# Patient Record
Sex: Male | Born: 1955 | Race: White | Hispanic: No | Marital: Single | Smoking: Never smoker
Health system: Southern US, Community
[De-identification: ages and names within clinical notes are randomized; demographics above are authoritative.]

## PROBLEM LIST (undated history)

## (undated) DIAGNOSIS — M1990 Unspecified osteoarthritis, unspecified site: Secondary | ICD-10-CM

## (undated) DIAGNOSIS — R7303 Prediabetes: Secondary | ICD-10-CM

## (undated) HISTORY — PX: HERNIA REPAIR: SHX51

## (undated) HISTORY — DX: Unspecified osteoarthritis, unspecified site: M19.90

---

## 2013-02-04 ENCOUNTER — Encounter (INDEPENDENT_AMBULATORY_CARE_PROVIDER_SITE_OTHER): Payer: Self-pay

## 2013-02-04 ENCOUNTER — Ambulatory Visit (INDEPENDENT_AMBULATORY_CARE_PROVIDER_SITE_OTHER): Payer: BC Managed Care – PPO | Admitting: General Surgery

## 2013-02-04 ENCOUNTER — Encounter (INDEPENDENT_AMBULATORY_CARE_PROVIDER_SITE_OTHER): Payer: Self-pay | Admitting: General Surgery

## 2013-02-04 VITALS — BP 158/82 | HR 84 | Temp 98.9°F | Resp 14 | Ht 63.0 in | Wt 211.4 lb

## 2013-02-04 DIAGNOSIS — K4090 Unilateral inguinal hernia, without obstruction or gangrene, not specified as recurrent: Secondary | ICD-10-CM | POA: Insufficient documentation

## 2013-02-04 NOTE — H&P (Signed)
Blake Adams is an 57 y.o. male.   Chief Complaint: Bilateral inguinal hernia HPI: Has had these hernias for years.  Recently more symptomatic, but not incarcerated.  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  Past Medical History  Diagnosis Date  . Arthritis     History reviewed. No pertinent past surgical history.  Family History  Problem Relation Age of Onset  . Cancer Maternal Aunt     breast  . Cancer Paternal Uncle     colon  . Cancer Maternal Aunt     OVARIAN   Social History:  reports that he has never smoked. He has never used smokeless tobacco. He reports that he does not drink alcohol or use illicit drugs.  Allergies: Not on File   (Not in a hospital admission)  No results found for this or any previous visit (from the past 48 hour(s)). No results found.  Review of Systems  Constitutional: Negative.   HENT: Negative.   Eyes: Negative.   Respiratory: Negative.   Cardiovascular: Negative.   Gastrointestinal: Negative.   Genitourinary: Negative.   Musculoskeletal: Negative.   Skin: Negative.   Neurological: Negative.   Endo/Heme/Allergies: Negative.   Psychiatric/Behavioral: Negative.     Blood pressure 158/82, pulse 84, temperature 98.9 F (37.2 C), temperature source Temporal, resp. rate 14, height 5\' 3"  (1.6 m), weight 211 lb 6.4 oz (95.89 kg). Physical Exam  Constitutional: He appears well-developed and well-nourished.  Moderately overweight   HENT:  Head: Normocephalic and atraumatic.  Eyes: Conjunctivae and EOM are normal. Pupils are equal, round, and reactive to light.  Neck: Normal range of motion. Neck supple.  Cardiovascular: Normal rate, regular rhythm and normal heart sounds.   Respiratory: Effort normal and breath sounds normal.  GI: Soft. Bowel sounds are normal. There is tenderness. A hernia is present.  Hernia confirmed positive in the right inguinal area (largest on the right.) and confirmed positive in the left inguinal area.  Genitourinary: Penis normal.  Musculoskeletal: Normal range of motion.       Right wrist: He exhibits tenderness (in splint from a recent fall).  Neurological: He is alert.  Skin: Skin is warm.  Psychiatric: He has a normal mood and affect. His behavior is normal. Judgment and thought content normal.     Assessment/Plan Bilateral reducible inguinal hernias.  Because of the patient's size and the size of his hernias I do not think he is the best candidate for laparoscopic repair. We plan on bilateral opening of the hernia repairs with mesh. Risk and benefits have been explained to the patient and he wishes to proceed. There is a moderate chance of postoperative urinary retention. This has been explained to the patient. He will get preoperative antibiotics.  Cherylynn Ridges 02/04/2013, 11:00 AM

## 2013-02-04 NOTE — Progress Notes (Signed)
This patient's office visit was documented as a preoperative history and physical. 

## 2013-02-19 ENCOUNTER — Encounter (HOSPITAL_BASED_OUTPATIENT_CLINIC_OR_DEPARTMENT_OTHER): Payer: Self-pay | Admitting: *Deleted

## 2013-02-21 ENCOUNTER — Encounter (HOSPITAL_BASED_OUTPATIENT_CLINIC_OR_DEPARTMENT_OTHER)
Admission: RE | Admit: 2013-02-21 | Discharge: 2013-02-21 | Disposition: A | Discharge status: Home / Self Care | Payer: Federal, State, Local not specified - PPO | Source: Ambulatory Visit | Attending: General Surgery | Admitting: General Surgery

## 2013-02-21 LAB — CBC WITH DIFFERENTIAL/PLATELET
Basophils Relative: 1 % (ref 0–1)
Eosinophils Absolute: 0.3 10*3/uL (ref 0.0–0.7)
Hemoglobin: 14.8 g/dL (ref 13.0–17.0)
Lymphocytes Relative: 16 % (ref 12–46)
Lymphs Abs: 1.7 10*3/uL (ref 0.7–4.0)
MCH: 29.7 pg (ref 26.0–34.0)
MCHC: 33.5 g/dL (ref 30.0–36.0)
Monocytes Relative: 10 % (ref 3–12)
Neutro Abs: 7.4 10*3/uL (ref 1.7–7.7)
Neutrophils Relative %: 70 % (ref 43–77)
Platelets: 317 10*3/uL (ref 150–400)
RBC: 4.99 MIL/uL (ref 4.22–5.81)
WBC: 10.5 10*3/uL (ref 4.0–10.5)

## 2013-02-21 LAB — BASIC METABOLIC PANEL
Calcium: 10.3 mg/dL (ref 8.4–10.5)
Chloride: 100 mEq/L (ref 96–112)
GFR calc Af Amer: >90 mL/min (ref >90)
GFR calc non Af Amer: >90 mL/min (ref >90)
Potassium: 4.7 mEq/L (ref 3.5–5.1)
Sodium: 139 mEq/L (ref 135–145)

## 2013-02-24 ENCOUNTER — Ambulatory Visit (HOSPITAL_BASED_OUTPATIENT_CLINIC_OR_DEPARTMENT_OTHER): Payer: Federal, State, Local not specified - PPO | Admitting: Certified Registered"

## 2013-02-24 ENCOUNTER — Encounter (HOSPITAL_BASED_OUTPATIENT_CLINIC_OR_DEPARTMENT_OTHER)
Admission: RE | Disposition: A | Discharge status: Home / Self Care | Payer: Self-pay | Source: Ambulatory Visit | Attending: General Surgery

## 2013-02-24 ENCOUNTER — Encounter (HOSPITAL_BASED_OUTPATIENT_CLINIC_OR_DEPARTMENT_OTHER): Payer: Self-pay

## 2013-02-24 ENCOUNTER — Encounter (HOSPITAL_BASED_OUTPATIENT_CLINIC_OR_DEPARTMENT_OTHER): Payer: Federal, State, Local not specified - PPO | Admitting: Certified Registered"

## 2013-02-24 ENCOUNTER — Ambulatory Visit (HOSPITAL_BASED_OUTPATIENT_CLINIC_OR_DEPARTMENT_OTHER)
Admission: RE | Admit: 2013-02-24 | Discharge: 2013-02-24 | Disposition: A | Discharge status: Home / Self Care | Payer: Federal, State, Local not specified - PPO | Source: Ambulatory Visit | Attending: General Surgery | Admitting: General Surgery

## 2013-02-24 DIAGNOSIS — Z01810 Encounter for preprocedural cardiovascular examination: Secondary | ICD-10-CM | POA: Insufficient documentation

## 2013-02-24 DIAGNOSIS — Z01812 Encounter for preprocedural laboratory examination: Secondary | ICD-10-CM | POA: Insufficient documentation

## 2013-02-24 DIAGNOSIS — M129 Arthropathy, unspecified: Secondary | ICD-10-CM | POA: Insufficient documentation

## 2013-02-24 DIAGNOSIS — K4090 Unilateral inguinal hernia, without obstruction or gangrene, not specified as recurrent: Secondary | ICD-10-CM

## 2013-02-24 HISTORY — PX: INSERTION OF MESH: SHX5868

## 2013-02-24 HISTORY — PX: INGUINAL HERNIA REPAIR: SHX194

## 2013-02-24 LAB — POCT HEMOGLOBIN-HEMACUE: Hemoglobin: 13.3 g/dL (ref 13.0–17.0)

## 2013-02-24 SURGERY — REPAIR, HERNIA, INGUINAL, ADULT
Anesthesia: General | Site: Groin | Laterality: Bilateral

## 2013-02-24 SURGERY — REPAIR, HERNIA, INGUINAL, BILATERAL, ADULT
Anesthesia: General | Laterality: Bilateral

## 2013-02-24 MED ORDER — MIDAZOLAM HCL 2 MG/2ML IJ SOLN: 1.0000 mg | INTRAMUSCULAR | Status: DC | PRN

## 2013-02-24 MED ORDER — OXYCODONE-ACETAMINOPHEN 5-325 MG PO TABS: 1.0000 | ORAL_TABLET | ORAL | Status: DC | PRN

## 2013-02-24 MED ORDER — MIDAZOLAM HCL 2 MG/2ML IJ SOLN
INTRAMUSCULAR | Status: AC
  Filled 2013-02-24: qty 2

## 2013-02-24 MED ORDER — CEFAZOLIN SODIUM-DEXTROSE 2-3 GM-% IV SOLR
2.0000 g | INTRAVENOUS | Status: AC
  Administered 2013-02-24: 2 g via INTRAVENOUS

## 2013-02-24 MED ORDER — OXYCODONE HCL 5 MG PO TABS
ORAL_TABLET | ORAL | Status: AC
  Filled 2013-02-24: qty 1

## 2013-02-24 MED ORDER — FENTANYL CITRATE 0.05 MG/ML IJ SOLN: 50.0000 ug | INTRAMUSCULAR | Status: DC | PRN

## 2013-02-24 MED ORDER — GLYCOPYRROLATE 0.2 MG/ML IJ SOLN
INTRAMUSCULAR | Status: DC | PRN
  Administered 2013-02-24: .5 mg via INTRAVENOUS

## 2013-02-24 MED ORDER — PROPOFOL 10 MG/ML IV BOLUS
INTRAVENOUS | Status: DC | PRN
  Administered 2013-02-24: 200 mg via INTRAVENOUS

## 2013-02-24 MED ORDER — BUPIVACAINE HCL (PF) 0.5 % IJ SOLN
INTRAMUSCULAR | Status: DC | PRN
  Administered 2013-02-24 (×2): 10 mL

## 2013-02-24 MED ORDER — HYDROMORPHONE HCL PF 1 MG/ML IJ SOLN
0.2500 mg | INTRAMUSCULAR | Status: DC | PRN
  Administered 2013-02-24: 0.5 mg via INTRAVENOUS

## 2013-02-24 MED ORDER — BUPIVACAINE HCL (PF) 0.5 % IJ SOLN
INTRAMUSCULAR | Status: AC
  Filled 2013-02-24: qty 30

## 2013-02-24 MED ORDER — NEOSTIGMINE METHYLSULFATE 1 MG/ML IJ SOLN
INTRAMUSCULAR | Status: DC | PRN
  Administered 2013-02-24: 3.5 mg via INTRAVENOUS

## 2013-02-24 MED ORDER — ONDANSETRON HCL 4 MG/2ML IJ SOLN
INTRAMUSCULAR | Status: DC | PRN
  Administered 2013-02-24: 4 mg via INTRAVENOUS

## 2013-02-24 MED ORDER — ROCURONIUM BROMIDE 100 MG/10ML IV SOLN
INTRAVENOUS | Status: DC | PRN
  Administered 2013-02-24: 10 mg via INTRAVENOUS
  Administered 2013-02-24: 50 mg via INTRAVENOUS

## 2013-02-24 MED ORDER — DEXAMETHASONE SODIUM PHOSPHATE 4 MG/ML IJ SOLN
INTRAMUSCULAR | Status: DC | PRN
  Administered 2013-02-24: 10 mg via INTRAVENOUS

## 2013-02-24 MED ORDER — MIDAZOLAM HCL 5 MG/5ML IJ SOLN
INTRAMUSCULAR | Status: DC | PRN
  Administered 2013-02-24: 2 mg via INTRAVENOUS

## 2013-02-24 MED ORDER — LIDOCAINE HCL (CARDIAC) 20 MG/ML IV SOLN
INTRAVENOUS | Status: DC | PRN
  Administered 2013-02-24: 60 mg via INTRAVENOUS

## 2013-02-24 MED ORDER — FENTANYL CITRATE 0.05 MG/ML IJ SOLN
INTRAMUSCULAR | Status: AC
  Filled 2013-02-24: qty 6

## 2013-02-24 MED ORDER — METOCLOPRAMIDE HCL 5 MG/ML IJ SOLN: 10.0000 mg | Freq: Once | INTRAMUSCULAR | Status: DC | PRN

## 2013-02-24 MED ORDER — SODIUM CHLORIDE 0.9 % IR SOLN
Status: DC | PRN
  Administered 2013-02-24: 14:00:00

## 2013-02-24 MED ORDER — OXYCODONE HCL 5 MG/5ML PO SOLN: 5.0000 mg | Freq: Once | ORAL | Status: AC | PRN

## 2013-02-24 MED ORDER — OXYCODONE HCL 5 MG PO TABS
5.0000 mg | ORAL_TABLET | Freq: Once | ORAL | Status: AC | PRN
  Administered 2013-02-24: 5 mg via ORAL

## 2013-02-24 MED ORDER — LACTATED RINGERS IV SOLN
INTRAVENOUS | Status: DC
  Administered 2013-02-24 (×2): via INTRAVENOUS

## 2013-02-24 MED ORDER — HYDROMORPHONE HCL PF 1 MG/ML IJ SOLN
INTRAMUSCULAR | Status: AC
  Filled 2013-02-24: qty 1

## 2013-02-24 MED ORDER — FENTANYL CITRATE 0.05 MG/ML IJ SOLN
INTRAMUSCULAR | Status: DC | PRN
  Administered 2013-02-24 (×2): 50 ug via INTRAVENOUS
  Administered 2013-02-24: 100 ug via INTRAVENOUS

## 2013-02-24 MED ORDER — CEFAZOLIN SODIUM-DEXTROSE 2-3 GM-% IV SOLR
INTRAVENOUS | Status: AC
  Filled 2013-02-24: qty 50

## 2013-02-24 SURGICAL SUPPLY — 51 items
BAG DECANTER FOR FLEXI CONT (MISCELLANEOUS) ×2 IMPLANT
BLADE SURG 10 STRL SS (BLADE) ×2 IMPLANT
BLADE SURG 15 STRL LF DISP TIS (BLADE) ×2 IMPLANT
BLADE SURG 15 STRL SS (BLADE) ×2
BLADE SURG ROTATE 9660 (MISCELLANEOUS) ×2 IMPLANT
CANISTER SUCT 1200ML W/VALVE (MISCELLANEOUS) ×2 IMPLANT
CHLORAPREP W/TINT 26ML (MISCELLANEOUS) ×2 IMPLANT
CLEANER CAUTERY TIP 5X5 PAD (MISCELLANEOUS) ×1 IMPLANT
COVER MAYO STAND STRL (DRAPES) ×2 IMPLANT
COVER TABLE BACK 60X90 (DRAPES) ×2 IMPLANT
DECANTER SPIKE VIAL GLASS SM (MISCELLANEOUS) ×2 IMPLANT
DERMABOND ADVANCED (GAUZE/BANDAGES/DRESSINGS) ×1
DERMABOND ADVANCED .7 DNX12 (GAUZE/BANDAGES/DRESSINGS) ×1 IMPLANT
DRAIN PENROSE 1/2X12 LTX STRL (WOUND CARE) ×2 IMPLANT
DRAPE LAPAROTOMY TRNSV 102X78 (DRAPE) ×2 IMPLANT
DRAPE UTILITY XL STRL (DRAPES) ×2 IMPLANT
DRSG TEGADERM 2-3/8X2-3/4 SM (GAUZE/BANDAGES/DRESSINGS) IMPLANT
DRSG TEGADERM 4X4.75 (GAUZE/BANDAGES/DRESSINGS) ×4 IMPLANT
ELECT REM PT RETURN 9FT ADLT (ELECTROSURGICAL) ×2
ELECTRODE REM PT RTRN 9FT ADLT (ELECTROSURGICAL) ×1 IMPLANT
GLOVE BIO SURGEON STRL SZ7 (GLOVE) ×2 IMPLANT
GLOVE BIOGEL PI IND STRL 7.0 (GLOVE) ×1 IMPLANT
GLOVE BIOGEL PI IND STRL 8 (GLOVE) ×1 IMPLANT
GLOVE BIOGEL PI INDICATOR 7.0 (GLOVE) ×1
GLOVE BIOGEL PI INDICATOR 8 (GLOVE) ×1
GLOVE ECLIPSE 6.5 STRL STRAW (GLOVE) ×4 IMPLANT
GLOVE ECLIPSE 7.5 STRL STRAW (GLOVE) ×2 IMPLANT
GOWN PREVENTION PLUS XLARGE (GOWN DISPOSABLE) ×6 IMPLANT
MESH HERNIA 3X6 (Mesh General) ×2 IMPLANT
NEEDLE HYPO 25X1 1.5 SAFETY (NEEDLE) ×2 IMPLANT
NS IRRIG 1000ML POUR BTL (IV SOLUTION) ×2 IMPLANT
PACK BASIN DAY SURGERY FS (CUSTOM PROCEDURE TRAY) ×2 IMPLANT
PAD CLEANER CAUTERY TIP 5X5 (MISCELLANEOUS) ×1
PENCIL BUTTON HOLSTER BLD 10FT (ELECTRODE) ×2 IMPLANT
SLEEVE SCD COMPRESS KNEE MED (MISCELLANEOUS) ×2 IMPLANT
SPONGE INTESTINAL PEANUT (DISPOSABLE) ×2 IMPLANT
SPONGE LAP 4X18 X RAY DECT (DISPOSABLE) ×6 IMPLANT
STRIP CLOSURE SKIN 1/2X4 (GAUZE/BANDAGES/DRESSINGS) ×4 IMPLANT
SUT ETHIBOND 0 MO6 C/R (SUTURE) ×4 IMPLANT
SUT MON AB 4-0 PC3 18 (SUTURE) ×4 IMPLANT
SUT PROLENE 0 CT 2 (SUTURE) ×8 IMPLANT
SUT VIC AB 3-0 SH 27 (SUTURE) ×4
SUT VIC AB 3-0 SH 27X BRD (SUTURE) ×4 IMPLANT
SUT VICRYL 4-0 PS2 18IN ABS (SUTURE) IMPLANT
SUT VICRYL AB 3 0 TIES (SUTURE) ×2 IMPLANT
SYR BULB 3OZ (MISCELLANEOUS) ×2 IMPLANT
SYR CONTROL 10ML LL (SYRINGE) ×2 IMPLANT
TOWEL OR 17X24 6PK STRL BLUE (TOWEL DISPOSABLE) ×4 IMPLANT
TOWEL OR NON WOVEN STRL DISP B (DISPOSABLE) IMPLANT
TUBE CONNECTING 20X1/4 (TUBING) ×2 IMPLANT
YANKAUER SUCT BULB TIP NO VENT (SUCTIONS) ×2 IMPLANT

## 2013-02-24 NOTE — Op Note (Signed)
OPERATIVE REPORT  DATE OF OPERATION: 02/24/2013  PATIENT:  Blake Adams  57 y.o. male  PRE-OPERATIVE DIAGNOSIS:  Bilateral inguinal hernias  POST-OPERATIVE DIAGNOSIS:  Bilateral direct inguinal hernias  PROCEDURE:  Procedure(s): HERNIA REPAIR INGUINAL ADULT BILATERAL WITH MESH INSERTION OF MESH  SURGEON:  Surgeon(s): Cherylynn Ridges, MD  ASSISTANT: Blue, PA-S  ANESTHESIA:   general  EBL: <50 ml  BLOOD ADMINISTERED: none  DRAINS: none   SPECIMEN:  No Specimen  COUNTS CORRECT:  YES  PROCEDURE DETAILS: The patient was taken to the operating room and placed on the table in the supine position. After an adequate general endotracheal anesthetic was administered he was prepped and draped in usual sterile manner exposing both groins.  After proper time out was performed identifying the patient and the procedure to be performed, we started off on the right side repairing the right inguinal hernia. The left side was repaired almost identically but the differences to be dictated later.  A transverse curvilinear incision approximately 8 cm long was made using a #15 blade and taken down to subcutaneous tissue. We used electrocautery to obtain adequate hemostasis and also to dissect down to the external oblique fascia. We opened the fascia along its fibers down through the superficial ring. We then mobilized the spermatic cord at the pubic tubercle using a Penrose drain. It was noted immediately that the patient had a large direct in the hernia. There was no indirect component on the right side or the left side.  A large direct hernia was dissected free of the spermatic cord. We then excised the major portion of the sac then reapproximated the edges of the sac with each other using 0 Ethibond suture. We then imbricated the suture line using 0 Ethibond suture. We then inserted an oval piece of polypropylene mesh measuring approximately 5 x 3 cm in size to the pubic tubercle attached it to the  conjoin tendon anterior medially and reflected portion of the inguinal ligament inferolaterally.  Once this was done we irrigated with antibiotic solution. We allowed the spermatic large fall back into the inguinal canal. We reapproximated the actual date fascia on top the cord. We then reapproximated the Scarpa's fascia using interrupted 0 Vicryl sutures. We injected 0.5% Marcaine into the subcutaneous tissue. We then reapproximated the skin using running subcuticular stitch of 3-0 Monocryl.  The left side was done almost identically with the exception that the excess hernia sac was not excised. We just inverted the hernia sac into the defect and imbricated the transversalis fascia on top of using interrupted 0 Ethibond suture. We then repaired the floor with mesh as we had done on the right side and the closure was the same all needle counts, sponge counts, and instrument counts were correct. 0.5% Marcaine was injected into the subcutaneous tissue on the left side also and the skin was closed using Dermabond 4-0 Monocryl and Steri-Strips and a Tegaderm.  PATIENT DISPOSITION:  PACU - hemodynamically stable.   Kenzli Barritt O 12/8/20142:52 PM

## 2013-02-24 NOTE — Interval H&P Note (Signed)
History and Physical Interval Note:  02/24/2013 11:24 AM  Blake Adams  has presented today for surgery, with the diagnosis of Bilateral inguinal hernias  The various methods of treatment have been discussed with the patient and family. After consideration of risks, benefits and other options for treatment, the patient has consented to  Procedure(s): HERNIA REPAIR INGUINAL ADULT BILATERAL WITH MESH (Bilateral) INSERTION OF MESH (Bilateral) as a surgical intervention .  The patient's history has been reviewed, patient examined, no change in status, stable for surgery.  I have reviewed the patient's chart and labs.  Questions were answered to the patient's satisfaction.     Marriana Hibberd, Marta Lamas

## 2013-02-24 NOTE — H&P (View-Only) (Signed)
Blake Adams is an 57 y.o. male.   Chief Complaint: Bilateral inguinal hernia HPI: Has had these hernias for years.  Recently more symptomatic, but not incarcerated.  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  Past Medical History  Diagnosis Date  . Arthritis     History reviewed. No pertinent past surgical history.  Family History  Problem Relation Age of Onset  . Cancer Maternal Aunt     breast  . Cancer Paternal Uncle     colon  . Cancer Maternal Aunt     OVARIAN   Social History:  reports that he has never smoked. He has never used smokeless tobacco. He reports that he does not drink alcohol or use illicit drugs.  Allergies: Not on File   (Not in a hospital admission)  No results found for this or any previous visit (from the past 48 hour(s)). No results found.  Review of Systems  Constitutional: Negative.   HENT: Negative.   Eyes: Negative.   Respiratory: Negative.   Cardiovascular: Negative.   Gastrointestinal: Negative.   Genitourinary: Negative.   Musculoskeletal: Negative.   Skin: Negative.   Neurological: Negative.   Endo/Heme/Allergies: Negative.   Psychiatric/Behavioral: Negative.     Blood pressure 158/82, pulse 84, temperature 98.9 F (37.2 C), temperature source Temporal, resp. rate 14, height 5' 3" (1.6 m), weight 211 lb 6.4 oz (95.89 kg). Physical Exam  Constitutional: He appears well-developed and well-nourished.  Moderately overweight   HENT:  Head: Normocephalic and atraumatic.  Eyes: Conjunctivae and EOM are normal. Pupils are equal, round, and reactive to light.  Neck: Normal range of motion. Neck supple.  Cardiovascular: Normal rate, regular rhythm and normal heart sounds.   Respiratory: Effort normal and breath sounds normal.  GI: Soft. Bowel sounds are normal. There is tenderness. A hernia is present.  Hernia confirmed positive in the right inguinal area (largest on the right.) and confirmed positive in the left inguinal area.  Genitourinary: Penis normal.  Musculoskeletal: Normal range of motion.       Right wrist: He exhibits tenderness (in splint from a recent fall).  Neurological: He is alert.  Skin: Skin is warm.  Psychiatric: He has a normal mood and affect. His behavior is normal. Judgment and thought content normal.     Assessment/Plan Bilateral reducible inguinal hernias.  Because of the patient's size and the size of his hernias I do not think he is the best candidate for laparoscopic repair. We plan on bilateral opening of the hernia repairs with mesh. Risk and benefits have been explained to the patient and he wishes to proceed. There is a moderate chance of postoperative urinary retention. This has been explained to the patient. He will get preoperative antibiotics.  Rey Dansby O 02/04/2013, 11:00 AM    

## 2013-02-24 NOTE — Anesthesia Preprocedure Evaluation (Signed)
Anesthesia Evaluation  Patient identified by MRN, date of birth, ID band Patient awake    Reviewed: Allergy & Precautions, H&P , NPO status , Patient's Chart, lab work & pertinent test results, reviewed documented beta blocker date and time   Airway Mallampati: II TM Distance: >3 FB Neck ROM: full    Dental   Pulmonary neg pulmonary ROS,  breath sounds clear to auscultation        Cardiovascular negative cardio ROS  Rhythm:regular     Neuro/Psych negative neurological ROS  negative psych ROS   GI/Hepatic negative GI ROS, Neg liver ROS,   Endo/Other  negative endocrine ROS  Renal/GU negative Renal ROS  negative genitourinary   Musculoskeletal   Abdominal   Peds  Hematology negative hematology ROS (+)   Anesthesia Other Findings See surgeon's H&P   Reproductive/Obstetrics negative OB ROS                           Anesthesia Physical Anesthesia Plan  ASA: II  Anesthesia Plan: General   Post-op Pain Management:    Induction: Intravenous  Airway Management Planned: LMA and Oral ETT  Additional Equipment:   Intra-op Plan:   Post-operative Plan: Extubation in OR  Informed Consent: I have reviewed the patients History and Physical, chart, labs and discussed the procedure including the risks, benefits and alternatives for the proposed anesthesia with the patient or authorized representative who has indicated his/her understanding and acceptance.   Dental Advisory Given  Plan Discussed with: CRNA and Surgeon  Anesthesia Plan Comments:         Anesthesia Quick Evaluation

## 2013-02-24 NOTE — Transfer of Care (Signed)
Immediate Anesthesia Transfer of Care Note  Patient: Blake Adams  Procedure(s) Performed: Procedure(s): HERNIA REPAIR INGUINAL ADULT BILATERAL WITH MESH (Bilateral) INSERTION OF MESH (Bilateral)  Patient Location: PACU  Anesthesia Type:General  Level of Consciousness: awake, alert , oriented and patient cooperative  Airway & Oxygen Therapy: Patient Spontanous Breathing and Patient connected to face mask oxygen  Post-op Assessment: Report given to PACU RN and Post -op Vital signs reviewed and stable  Post vital signs: Reviewed and stable  Complications: No apparent anesthesia complications

## 2013-02-24 NOTE — Anesthesia Postprocedure Evaluation (Signed)
  Anesthesia Post-op Note  Patient: Blake Adams  Procedure(s) Performed: Procedure(s): HERNIA REPAIR INGUINAL ADULT BILATERAL WITH MESH (Bilateral) INSERTION OF MESH (Bilateral)  Patient Location: PACU  Anesthesia Type:General  Level of Consciousness: awake and alert   Airway and Oxygen Therapy: Patient Spontanous Breathing  Post-op Pain: moderate  Post-op Assessment: Post-op Vital signs reviewed, Patient's Cardiovascular Status Stable and Respiratory Function Stable  Post-op Vital Signs: Reviewed  Filed Vitals:   02/24/13 1530  BP: 128/81  Pulse: 75  Temp:   Resp: 13    Complications: No apparent anesthesia complications

## 2013-02-25 NOTE — Anesthesia Postprocedure Evaluation (Signed)
  Anesthesia Post-op Note  Patient: Blake Adams  Procedure(s) Performed: Procedure(s): HERNIA REPAIR INGUINAL ADULT BILATERAL WITH MESH (Bilateral) INSERTION OF MESH (Bilateral)  Patient Location: PACU  Anesthesia Type:General  Level of Consciousness: awake, alert  and oriented  Airway and Oxygen Therapy: Patient Spontanous Breathing  Post-op Pain: mild  Post-op Assessment: Post-op Vital signs reviewed, Patient's Cardiovascular Status Stable, Respiratory Function Stable, Patent Airway and Pain level controlled  Post-op Vital Signs: stable  Complications: No apparent anesthesia complications

## 2013-02-26 ENCOUNTER — Encounter (HOSPITAL_BASED_OUTPATIENT_CLINIC_OR_DEPARTMENT_OTHER): Payer: Self-pay | Admitting: General Surgery

## 2013-02-26 NOTE — Addendum Note (Signed)
Addendum created 02/26/13 1210 by Hart Robinsons, MD   Modules edited: Anesthesia Attestations

## 2013-03-05 ENCOUNTER — Other Ambulatory Visit (INDEPENDENT_AMBULATORY_CARE_PROVIDER_SITE_OTHER): Payer: Self-pay

## 2013-03-05 ENCOUNTER — Telehealth (INDEPENDENT_AMBULATORY_CARE_PROVIDER_SITE_OTHER): Payer: Self-pay

## 2013-03-05 DIAGNOSIS — G8918 Other acute postprocedural pain: Secondary | ICD-10-CM

## 2013-03-05 MED ORDER — OXYCODONE-ACETAMINOPHEN 5-325 MG PO TABS: 1.0000 | ORAL_TABLET | ORAL | Status: DC | PRN

## 2013-03-05 NOTE — Telephone Encounter (Signed)
Dr Lindie Spruce said this is not a burn but probable hematoma. Advised per Dr Lindie Spruce to apply warm compresses to area. If no better in next 24-48 hours she will call our office back. Dr Stormy Fabian refill of percocet. Will have urge doctor write for refill and they will pick up prescription this afternoon.

## 2013-03-05 NOTE — Telephone Encounter (Signed)
The patient's sister called to report that he has a blister on his penis filled with fluid after his hernia surgery.  She states he was covered with hot blankets before taken into the OR and she thinks it may be a burn.  She is requesting advice.

## 2013-03-18 ENCOUNTER — Encounter (INDEPENDENT_AMBULATORY_CARE_PROVIDER_SITE_OTHER): Payer: Self-pay | Admitting: General Surgery

## 2013-03-18 ENCOUNTER — Ambulatory Visit (INDEPENDENT_AMBULATORY_CARE_PROVIDER_SITE_OTHER): Payer: BC Managed Care – PPO | Admitting: General Surgery

## 2013-03-18 VITALS — BP 132/96 | HR 72 | Temp 98.7°F | Resp 14 | Ht 63.5 in | Wt 211.4 lb

## 2013-03-18 DIAGNOSIS — Z09 Encounter for follow-up examination after completed treatment for conditions other than malignant neoplasm: Secondary | ICD-10-CM | POA: Insufficient documentation

## 2013-03-18 NOTE — Progress Notes (Signed)
The patient is status post bilateral inguinal hernia repairs. About a week ago he had some significant blistering and swelling of his distal penis. This has completely resolved without rupturing.  New dressings were removed and incisions are intact with no evidence of dehiscence or infection. He is to return to see me on when necessary basis. He has no evidence of recurrent hernia.

## 2015-03-03 ENCOUNTER — Ambulatory Visit (HOSPITAL_COMMUNITY)
Admission: RE | Admit: 2015-03-03 | Discharge: 2015-03-03 | Disposition: A | Discharge status: Home / Self Care | Payer: Federal, State, Local not specified - PPO | Source: Ambulatory Visit | Attending: Family Medicine | Admitting: Family Medicine

## 2015-03-03 ENCOUNTER — Other Ambulatory Visit (HOSPITAL_COMMUNITY): Payer: Self-pay | Admitting: Family Medicine

## 2015-03-03 DIAGNOSIS — M25561 Pain in right knee: Secondary | ICD-10-CM

## 2015-03-03 DIAGNOSIS — M1711 Unilateral primary osteoarthritis, right knee: Secondary | ICD-10-CM | POA: Diagnosis not present

## 2015-04-16 ENCOUNTER — Ambulatory Visit (INDEPENDENT_AMBULATORY_CARE_PROVIDER_SITE_OTHER): Payer: Federal, State, Local not specified - PPO | Admitting: Urology

## 2015-04-16 ENCOUNTER — Other Ambulatory Visit: Payer: Self-pay | Admitting: Urology

## 2015-04-16 DIAGNOSIS — R972 Elevated prostate specific antigen [PSA]: Secondary | ICD-10-CM

## 2015-04-16 DIAGNOSIS — N400 Enlarged prostate without lower urinary tract symptoms: Secondary | ICD-10-CM

## 2015-04-23 ENCOUNTER — Ambulatory Visit (HOSPITAL_COMMUNITY)
Admission: RE | Admit: 2015-04-23 | Discharge: 2015-04-23 | Disposition: A | Discharge status: Home / Self Care | Payer: Federal, State, Local not specified - PPO | Source: Ambulatory Visit | Attending: Urology | Admitting: Urology

## 2015-04-23 ENCOUNTER — Encounter (HOSPITAL_COMMUNITY): Payer: Self-pay

## 2015-04-23 DIAGNOSIS — R972 Elevated prostate specific antigen [PSA]: Secondary | ICD-10-CM | POA: Insufficient documentation

## 2015-04-23 MED ORDER — CEFTRIAXONE SODIUM 1 G IJ SOLR
INTRAMUSCULAR | Status: AC
  Filled 2015-04-23: qty 10

## 2015-04-23 MED ORDER — LIDOCAINE HCL (PF) 1 % IJ SOLN
INTRAMUSCULAR | Status: AC
Start: 2015-04-23 — End: 2015-04-23
  Administered 2015-04-23: 2 mL
  Filled 2015-04-23: qty 5

## 2015-04-23 MED ORDER — CEFTRIAXONE SODIUM 1 G IJ SOLR
1.0000 g | Freq: Once | INTRAMUSCULAR | Status: AC
Start: 2015-04-23 — End: 2015-04-23
  Administered 2015-04-23: 1 g via INTRAMUSCULAR

## 2015-04-23 MED ORDER — LIDOCAINE HCL (PF) 2 % IJ SOLN
INTRAMUSCULAR | Status: AC
  Filled 2015-04-23: qty 10

## 2015-04-23 MED ORDER — LIDOCAINE HCL (PF) 2 % IJ SOLN
10.0000 mL | Freq: Once | INTRAMUSCULAR | Status: AC
  Administered 2015-04-23: 10 mL

## 2015-04-23 MED ORDER — LIDOCAINE HCL (PF) 2 % IJ SOLN
INTRAMUSCULAR | Status: AC
  Administered 2015-04-23: 12:00:00 10 mL
  Filled 2015-04-23: qty 10

## 2015-04-23 NOTE — Discharge Instructions (Signed)
Transrectal Ultrasound-Guided Biopsy °A transrectal ultrasound-guided biopsy is a procedure to take samples of tissue from your prostate. Ultrasound images are used to guide the procedure. It is usually done to check the prostate gland for cancer. °BEFORE THE PROCEDURE °· Do not eat or drink after midnight on the night before your procedure. °· Take medicines as your doctor tells you. °· Your doctor may have you stop taking some medicines 5-7 days before the procedure. °· You will be given an enema before your procedure. During an enema, a liquid is put into your butt (rectum) to clear out waste. °· You may have lab tests the day of your procedure. °· Make plans to have someone drive you home. °PROCEDURE °· You will be given medicine to help you relax before the procedure. An IV tube will be put into one of your veins. It will be used to give fluids and medicine. °· You will be given medicine to reduce the risk of infection (antibiotic). °· You will be placed on your side. °· A probe with gel will be put in your butt. This is used to take pictures of your prostate and the area around it. °· A medicine to numb the area is put into your prostate. °· A biopsy needle is then inserted and guided to your prostate. °· Samples of prostate tissue are taken. The needle is removed. °· The samples are sent to a lab to be checked. Results are usually back in 2-3 days. °AFTER THE PROCEDURE °· You will be taken to a room where you will be watched until you are doing okay. °· You may have some pain in the area around your butt. You will be given medicines for this. °· You may be able to go home the same day. Sometimes, an overnight stay in the hospital is needed. °  °This information is not intended to replace advice given to you by your health care provider. Make sure you discuss any questions you have with your health care provider. °  °Document Released: 02/22/2009 Document Revised: 03/11/2013 Document Reviewed:  10/23/2012 °Elsevier Interactive Patient Education ©2016 [employer] ° °

## 2015-11-05 ENCOUNTER — Ambulatory Visit (INDEPENDENT_AMBULATORY_CARE_PROVIDER_SITE_OTHER): Payer: Federal, State, Local not specified - PPO | Admitting: Urology

## 2015-11-05 DIAGNOSIS — R3121 Asymptomatic microscopic hematuria: Secondary | ICD-10-CM | POA: Diagnosis not present

## 2015-11-05 DIAGNOSIS — R972 Elevated prostate specific antigen [PSA]: Secondary | ICD-10-CM | POA: Diagnosis not present

## 2016-08-04 ENCOUNTER — Other Ambulatory Visit (HOSPITAL_COMMUNITY)
Admission: RE | Admit: 2016-08-04 | Discharge: 2016-08-04 | Disposition: A | Discharge status: Home / Self Care | Payer: Federal, State, Local not specified - PPO | Source: Other Acute Inpatient Hospital | Attending: Urology | Admitting: Urology

## 2016-08-04 ENCOUNTER — Ambulatory Visit (INDEPENDENT_AMBULATORY_CARE_PROVIDER_SITE_OTHER): Payer: Federal, State, Local not specified - PPO | Admitting: Urology

## 2016-08-04 DIAGNOSIS — N401 Enlarged prostate with lower urinary tract symptoms: Secondary | ICD-10-CM

## 2016-08-04 DIAGNOSIS — R3121 Asymptomatic microscopic hematuria: Secondary | ICD-10-CM | POA: Insufficient documentation

## 2016-08-04 DIAGNOSIS — R972 Elevated prostate specific antigen [PSA]: Secondary | ICD-10-CM

## 2016-08-04 DIAGNOSIS — R3912 Poor urinary stream: Secondary | ICD-10-CM

## 2016-08-04 LAB — URINALYSIS, COMPLETE (UACMP) WITH MICROSCOPIC
Bacteria, UA: NONE SEEN
Bilirubin Urine: NEGATIVE
Glucose, UA: NEGATIVE mg/dL
KETONES UR: NEGATIVE mg/dL
Leukocytes, UA: NEGATIVE
Nitrite: NEGATIVE
PH: 5 (ref 5.0–8.0)
Protein, ur: NEGATIVE mg/dL
Specific Gravity, Urine: 1.019 (ref 1.005–1.030)

## 2017-02-02 ENCOUNTER — Ambulatory Visit: Payer: Federal, State, Local not specified - PPO | Admitting: Urology

## 2017-02-02 DIAGNOSIS — R351 Nocturia: Secondary | ICD-10-CM

## 2017-02-02 DIAGNOSIS — N401 Enlarged prostate with lower urinary tract symptoms: Secondary | ICD-10-CM

## 2017-02-02 DIAGNOSIS — R972 Elevated prostate specific antigen [PSA]: Secondary | ICD-10-CM

## 2017-06-26 IMAGING — US US BIOPSY PROSTATE MULTIPLE
1 series · 14 of 22 positions shown · non-contrast
Comparison: none

[Series 1: us biopsy prostate multiple · 0.11mm/px · 14 of 22 slices shown]
[im 1/22]
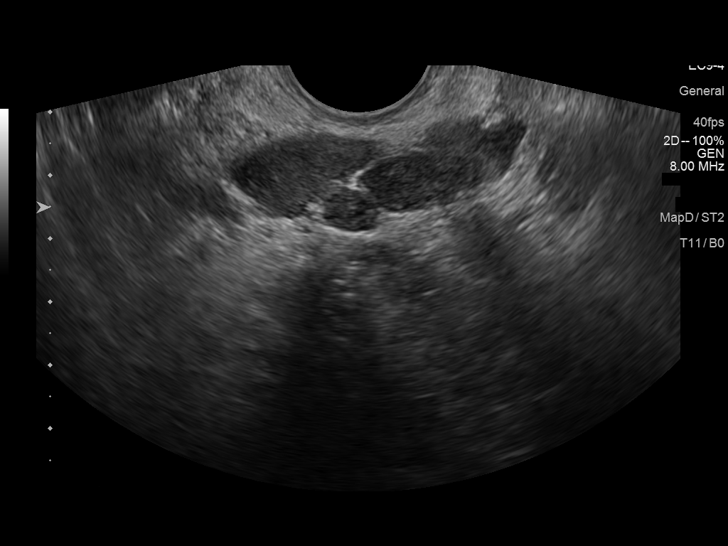
[im 3/22]
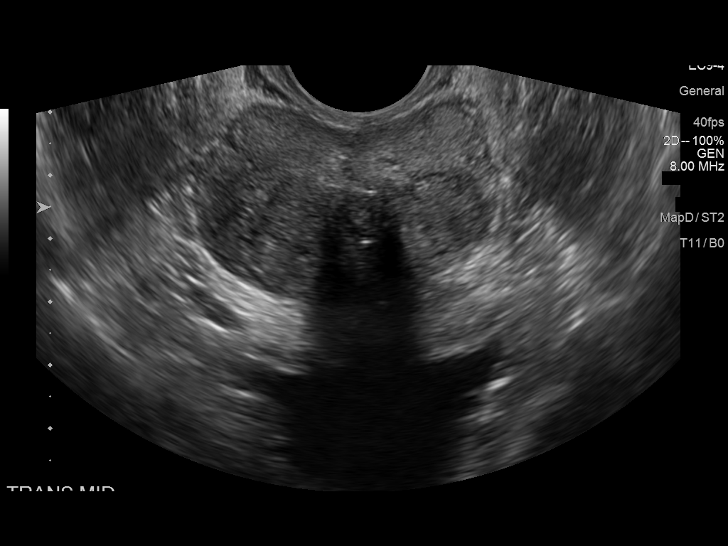
[im 4/22]
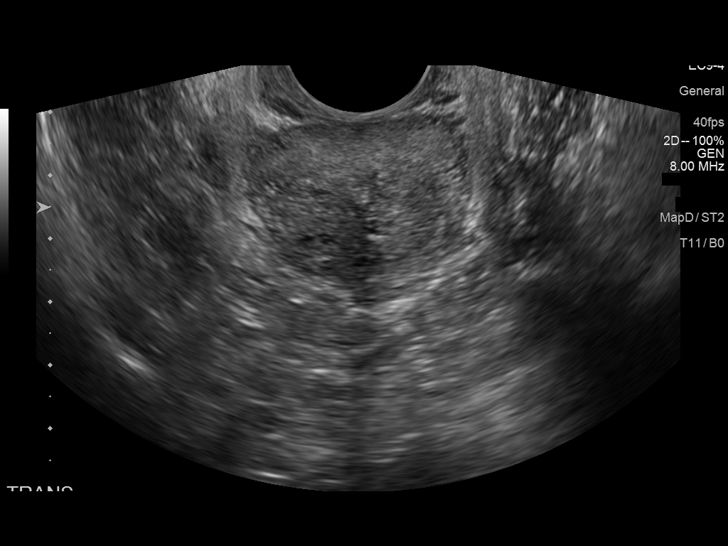
[im 6/22]
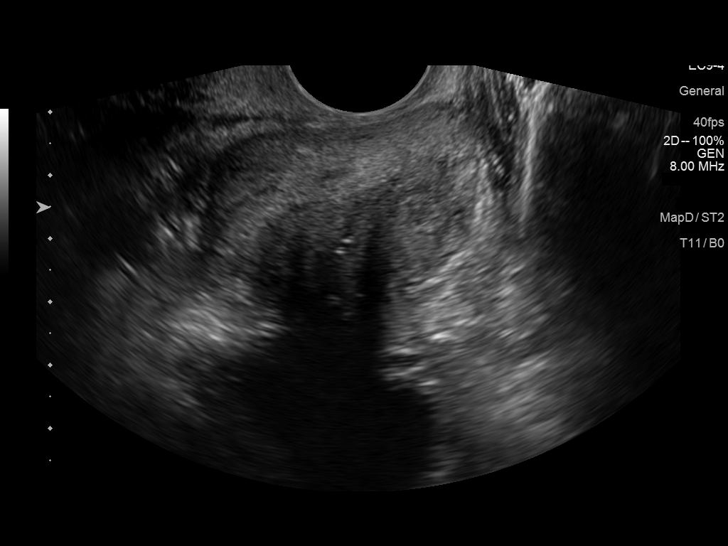
[im 8/22]
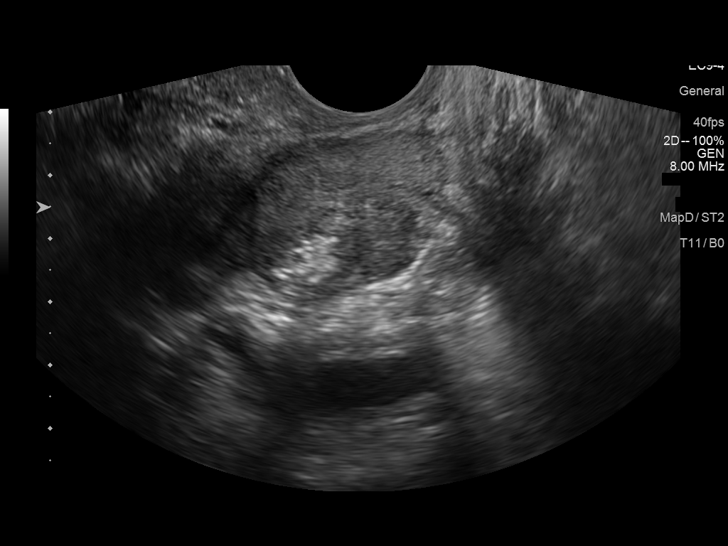
[im 9/22]
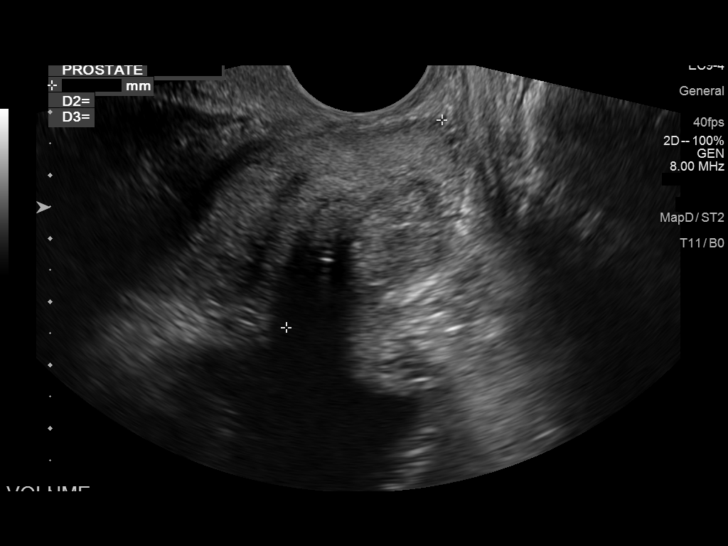
[im 11/22]
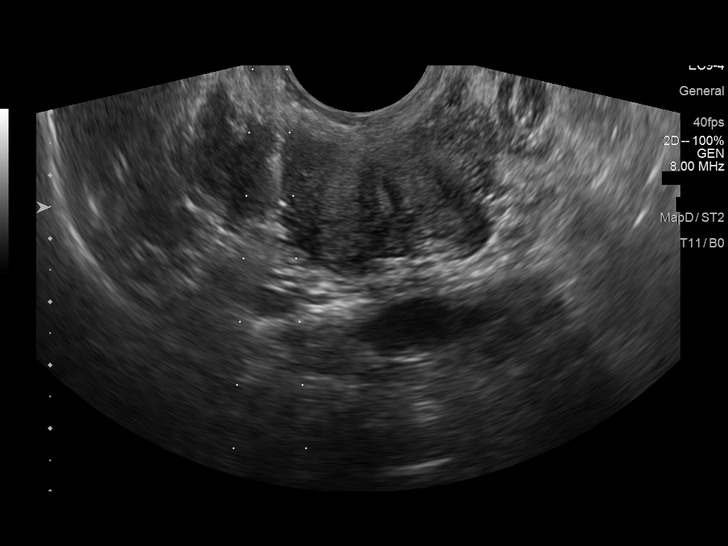
[im 12/22]
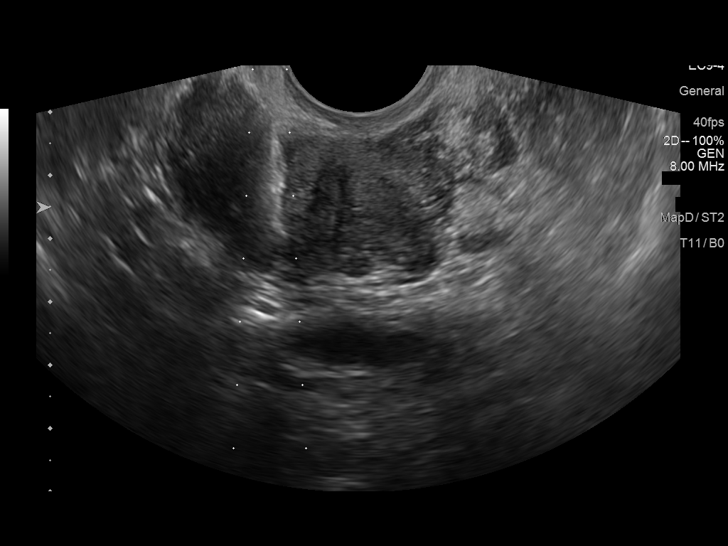
[im 14/22]
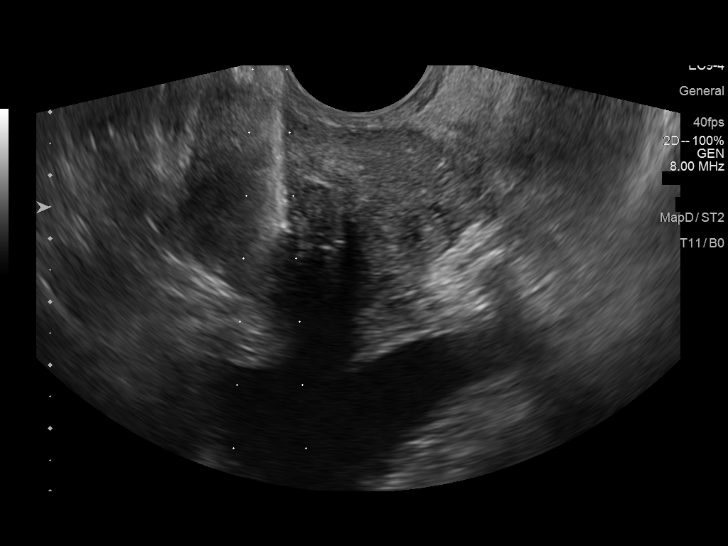
[im 15/22]
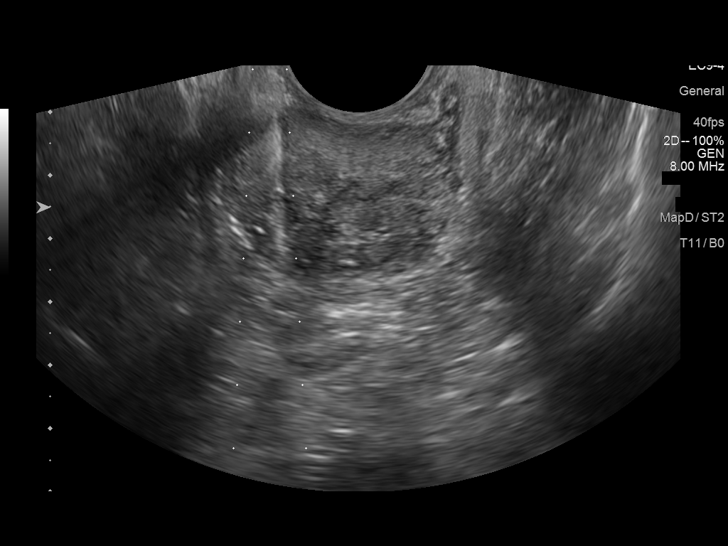
[im 17/22]
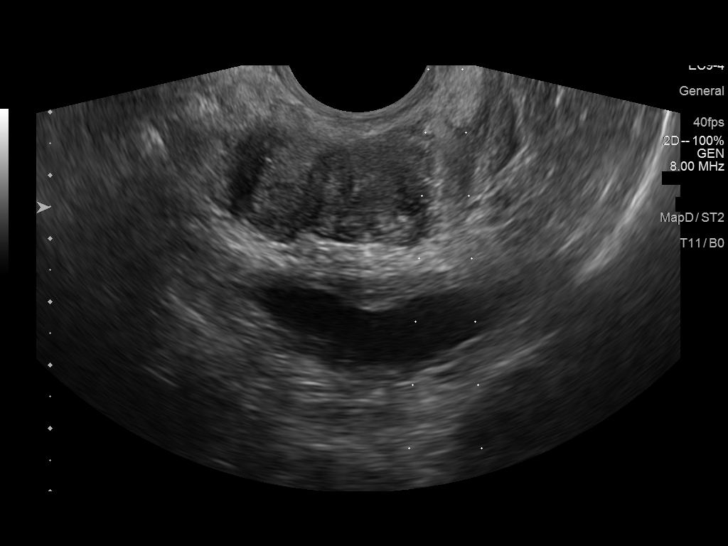
[im 19/22]
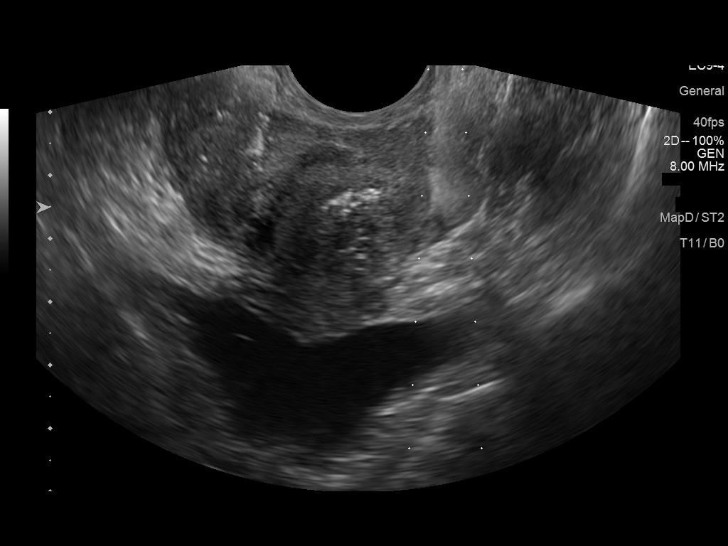
[im 20/22]
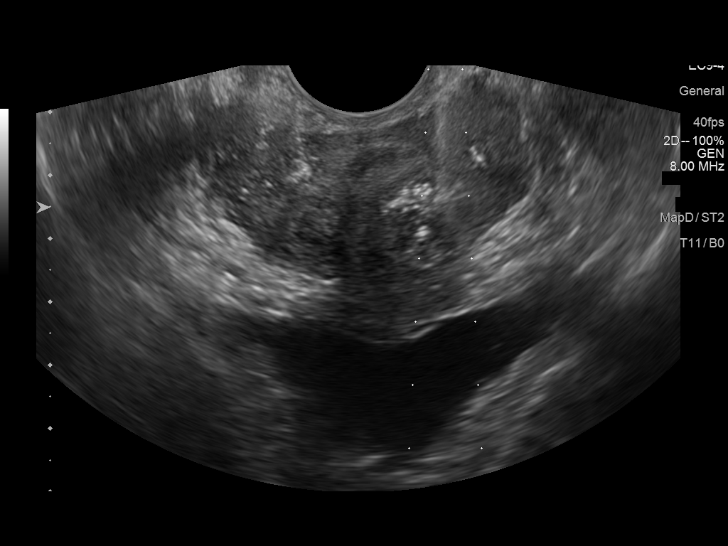
[im 22/22]
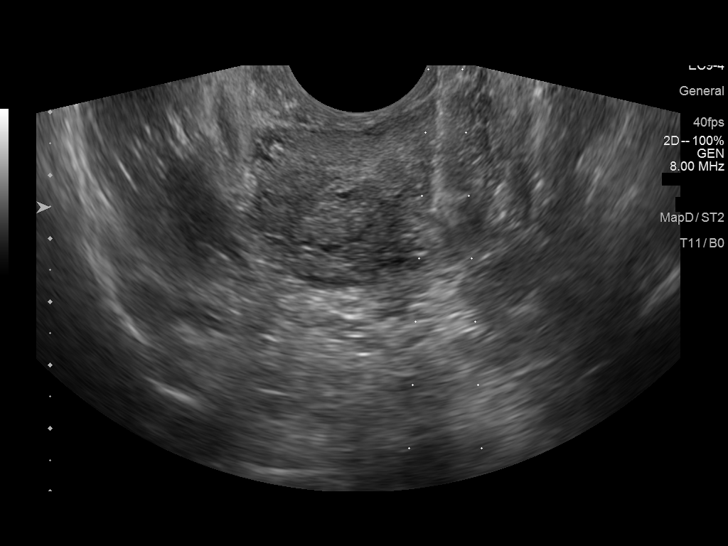

[14 of 22 positions shown; findings below may reference images not displayed]

Canned report from images found in remote index.

Refer to host system for actual result text.

## 2018-02-22 ENCOUNTER — Ambulatory Visit: Payer: Federal, State, Local not specified - PPO | Admitting: Urology

## 2018-02-22 DIAGNOSIS — R3912 Poor urinary stream: Secondary | ICD-10-CM

## 2018-02-22 DIAGNOSIS — N401 Enlarged prostate with lower urinary tract symptoms: Secondary | ICD-10-CM | POA: Diagnosis not present

## 2018-02-22 DIAGNOSIS — R972 Elevated prostate specific antigen [PSA]: Secondary | ICD-10-CM

## 2021-04-13 ENCOUNTER — Encounter: Payer: Self-pay | Admitting: Internal Medicine

## 2021-06-01 ENCOUNTER — Ambulatory Visit (INDEPENDENT_AMBULATORY_CARE_PROVIDER_SITE_OTHER): Payer: Self-pay | Admitting: *Deleted

## 2021-06-01 ENCOUNTER — Other Ambulatory Visit: Payer: Self-pay

## 2021-06-01 VITALS — Ht 64.0 in | Wt 245.0 lb

## 2021-06-01 DIAGNOSIS — Z1211 Encounter for screening for malignant neoplasm of colon: Secondary | ICD-10-CM

## 2021-06-01 NOTE — Progress Notes (Addendum)
Gastroenterology Pre-Procedure Review ? ?Request Date: 06/01/2021 ?Requesting Physician: Dr. Karie Kirks, no previous TCS ? ?PATIENT REVIEW QUESTIONS: The patient responded to the following health history questions as indicated:   ? ?1. Diabetes Melitis: yes, type II, takes medicine in evenings ?2. Joint replacements in the past 12 months: no ?3. Major health problems in the past 3 months: no ?4. Has an artificial valve or MVP: no ?5. Has a defibrillator: no ?6. Has been advised in past to take antibiotics in advance of a procedure like teeth cleaning: no ?7. Family history of colon cancer: yes, uncle: age 63's ?8. Alcohol Use: no ?9. Illicit drug Use: no ?10. History of sleep apnea: no ?11. History of coronary artery or other vascular stents placed within the last 12 months: no ?12. History of any prior anesthesia complications: no ?13. Body mass index is 42.05 kg/m?. ?   ?MEDICATIONS & ALLERGIES:    ?Patient reports the following regarding taking any blood thinners:   ?Plavix? no ?Aspirin? no ?Coumadin? no ?Brilinta? no ?Xarelto? no ?Eliquis? no ?Pradaxa? no ?Savaysa? no ?Effient? no ? ?Patient confirms/reports the following medications:  ?Current Outpatient Medications  ?Medication Sig Dispense Refill  ? acetaminophen (TYLENOL) 325 MG tablet Take 650 mg by mouth as needed.    ? Cholecalciferol (VITAMIN D3) 125 MCG (5000 UT) CAPS Take by mouth daily.    ? cyanocobalamin 2000 MCG tablet Take 2,000 mcg by mouth daily.    ? folic acid (FOLVITE) 1 MG tablet Take 1 mg by mouth daily.    ? losartan (COZAAR) 100 MG tablet Take 100 mg by mouth daily.    ? metFORMIN (GLUCOPHAGE-XR) 500 MG 24 hr tablet Take 500 mg by mouth every evening.    ? methotrexate 2.5 MG tablet Take 2.5 mg by mouth once a week. Takes 8 tablets weekly.    ? naproxen sodium (ALEVE) 220 MG tablet Take 220 mg by mouth as needed.    ? zinc gluconate 50 MG tablet Take 50 mg by mouth daily.    ? ?No current facility-administered medications for this visit.   ? ? ?Patient confirms/reports the following allergies:  ?No Known Allergies ? ?No orders of the defined types were placed in this encounter. ? ? ?AUTHORIZATION INFORMATION ?Primary Insurance: Medicare,  ID #: [id number] ?Pre-Cert / Josem Kaufmann required: No, not required ? ?Secondary Insurance: [place],  [place] #: [id number],  Group #: [id number] ?Pre-Cert / Josem Kaufmann required: No, not required ? ?Third Insurance: [employer], ID#: [id number] ? ?SCHEDULE INFORMATION: ?Procedure has been scheduled as follows:  ?Date: 06/20/2021 , Time: 12:00 ?Location: [facility] with Dr. Abbey Chatters ? ?This Gastroenterology Pre-Precedure Review Form is being routed to the following provider(s): Aliene Altes, PA-C ?  ?

## 2021-06-02 NOTE — Progress Notes (Signed)
Okay to schedule.  ASA 3 due to BMI. ? ?1 day prior to procedure: Okay to take metformin as prescribed. ?Day of procedure: No diabetes medications the morning prior to his procedure.  Would be okay to take his metformin that evening after his colonoscopy. ?

## 2021-06-06 ENCOUNTER — Encounter: Payer: Self-pay | Admitting: *Deleted

## 2021-06-06 MED ORDER — PEG 3350-KCL-NA BICARB-NACL 420 G PO SOLR: 4000.0000 mL | Freq: Once | ORAL | 0 refills | Status: AC

## 2021-06-06 NOTE — Addendum Note (Signed)
Addended by: Metro Kung on: 06/06/2021 03:33 PM ? ? Modules accepted: Orders ? ?

## 2021-06-07 ENCOUNTER — Encounter: Payer: Self-pay | Admitting: *Deleted

## 2021-06-07 NOTE — Progress Notes (Signed)
Spoke to pt.  Scheduled procedure for 06/20/2021.  Informed pt Pre-op scheduled for 06/16/2021 at 2:15.  Pt aware that he will be given procedure time at his Pre-op appointment.  Reviewed prep instructions with pt by phone.  Informed pt of medication adjustments.  Pt aware that I am mailing out instructions.  Confirmed mailing address. ?

## 2021-06-15 NOTE — Patient Instructions (Signed)
? ? ? ? ? ? Blake Adams ? 06/15/2021  ?  ? '@PREFPERIOPPHARMACY'$ @ ? ? Your procedure is scheduled on  06/20/2021. ? ? Report to [facility] at  1000 A.M. ? ? Call this number if you have problems the morning of surgery: ? [phone] ? ? Remember: ? Follow the diet and prep instructions given to you by the office. ? ?DO NOT take any medication for diabetes the morning of your procedure. ?  ? Take these medicines the morning of surgery with A SIP OF WATER  ? ?None. ?  ? Do not wear jewelry, make-up or nail polish. ? Do not wear lotions, powders, or perfumes, or deodorant. ? Do not shave 48 hours prior to surgery.  Men may shave face and neck. ? Do not bring valuables to the hospital. ? [facility] is not responsible for any belongings or valuables. ? ?Contacts, dentures or bridgework may not be worn into surgery.  Leave your suitcase in the car.  After surgery it may be brought to your room. ? ?For patients admitted to the hospital, discharge time will be determined by your treatment team. ? ?Patients discharged the day of surgery will not be allowed to drive home and must have someone with them for 24 hours.  ? ? ?Special instructions:   DO NOT smoke tobacco or vape for 24 hours before your procedure. ? ?Please read over the following fact sheets that you were given. ?Anesthesia Post-op Instructions and Care and Recovery After Surgery ?  ? ? ? Colonoscopy, Adult, Care After ?This sheet gives you information about how to care for yourself after your procedure. Your health care provider may also give you more specific instructions. If you have problems or questions, contact your health care provider. ?What can I expect after the procedure? ?After the procedure, it is common to have: ?A small amount of blood in your stool for 24 hours after the procedure. ?Some gas. ?Mild cramping or bloating of your abdomen. ?Follow these instructions at home: ?Eating and drinking ? ?Drink enough fluid to keep your urine pale  yellow. ?Follow instructions from your health care provider about eating or drinking restrictions. ?Resume your normal diet as instructed by your health care provider. Avoid heavy or fried foods that are hard to digest. ?Activity ?Rest as told by your health care provider. ?Avoid sitting for a long time without moving. Get up to take short walks every 1-2 hours. This is important to improve blood flow and breathing. Ask for help if you feel weak or unsteady. ?Return to your normal activities as told by your health care provider. Ask your health care provider what activities are safe for you. ?Managing cramping and bloating ? ?Try walking around when you have cramps or feel bloated. ?Apply heat to your abdomen as told by your health care provider. Use the heat source that your health care provider recommends, such as a moist heat pack or a heating pad. ?Place a towel between your skin and the heat source. ?Leave the heat on for 20-30 minutes. ?Remove the heat if your skin turns bright red. This is especially important if you are unable to feel pain, heat, or cold. You may have a greater risk of getting burned. ?General instructions ?If you were given a sedative during the procedure, it can affect you for several hours. Do not drive or operate machinery until your health care provider says that it is safe. ?For the first 24 hours after the procedure: ?  Do not sign important documents. ?Do not drink alcohol. ?Do your regular daily activities at a slower pace than normal. ?Eat soft foods that are easy to digest. ?Take over-the-counter and prescription medicines only as told by your health care provider. ?Keep all follow-up visits as told by your health care provider. This is important. ?Contact a health care provider if: ?You have blood in your stool 2-3 days after the procedure. ?Get help right away if you have: ?More than a small spotting of blood in your stool. ?Large blood clots in your stool. ?Swelling of your  abdomen. ?Nausea or vomiting. ?A fever. ?Increasing pain in your abdomen that is not relieved with medicine. ?Summary ?After the procedure, it is common to have a small amount of blood in your stool. You may also have mild cramping and bloating of your abdomen. ?If you were given a sedative during the procedure, it can affect you for several hours. Do not drive or operate machinery until your health care provider says that it is safe. ?Get help right away if you have a lot of blood in your stool, nausea or vomiting, a fever, or increased pain in your abdomen. ?This information is not intended to replace advice given to you by your health care provider. Make sure you discuss any questions you have with your health care provider. ?Document Revised: 01/10/2019 Document Reviewed: 09/30/2018 ?Elsevier Patient Education ? [place]. ?Monitored Anesthesia Care, Care After ?This sheet gives you information about how to care for yourself after your procedure. Your health care provider may also give you more specific instructions. If you have problems or questions, contact your health care provider. ?What can I expect after the procedure? ?After the procedure, it is common to have: ?Tiredness. ?Forgetfulness about what happened after the procedure. ?Impaired judgment for important decisions. ?Nausea or vomiting. ?Some difficulty with balance. ?Follow these instructions at home: ?For the time period you were told by your health care provider: ?  ?Rest as needed. ?Do not participate in activities where you could fall or become injured. ?Do not drive or use machinery. ?Do not drink alcohol. ?Do not take sleeping pills or medicines that cause drowsiness. ?Do not make important decisions or sign legal documents. ?Do not take care of children on your own. ?Eating and drinking ?Follow the diet that is recommended by your health care provider. ?Drink enough fluid to keep your urine pale yellow. ?If you vomit: ?Drink water,  juice, or soup when you can drink without vomiting. ?Make sure you have little or no nausea before eating solid foods. ?General instructions ?Have a responsible adult stay with you for the time you are told. It is important to have someone help care for you until you are awake and alert. ?Take over-the-counter and prescription medicines only as told by your health care provider. ?If you have sleep apnea, surgery and certain medicines can increase your risk for breathing problems. Follow instructions from your health care provider about wearing your sleep device: ?Anytime you are sleeping, including during daytime naps. ?While taking prescription pain medicines, sleeping medicines, or medicines that make you drowsy. ?Avoid smoking. ?Keep all follow-up visits as told by your health care provider. This is important. ?Contact a health care provider if: ?You keep feeling nauseous or you keep vomiting. ?You feel light-headed. ?You are still sleepy or having trouble with balance after 24 hours. ?You develop a rash. ?You have a fever. ?You have redness or swelling around the IV site. ?Get help right away  if: ?You have trouble breathing. ?You have new-onset confusion at home. ?Summary ?For several hours after your procedure, you may feel tired. You may also be forgetful and have poor judgment. ?Have a responsible adult stay with you for the time you are told. It is important to have someone help care for you until you are awake and alert. ?Rest as told. Do not drive or operate machinery. Do not drink alcohol or take sleeping pills. ?Get help right away if you have trouble breathing, or if you suddenly become confused. ?This information is not intended to replace advice given to you by your health care provider. Make sure you discuss any questions you have with your health care provider. ?Document Revised: 11/20/2019 Document Reviewed: 02/06/2019 ?Elsevier Patient Education ? [place]. ? ?

## 2021-06-16 ENCOUNTER — Encounter (HOSPITAL_COMMUNITY): Payer: Self-pay

## 2021-06-16 ENCOUNTER — Other Ambulatory Visit: Payer: Self-pay

## 2021-06-16 ENCOUNTER — Encounter (HOSPITAL_COMMUNITY)
Admission: RE | Admit: 2021-06-16 | Discharge: 2021-06-16 | Disposition: A | Discharge status: Home / Self Care | Payer: Medicare Other | Source: Ambulatory Visit | Attending: Internal Medicine | Admitting: Internal Medicine

## 2021-06-16 VITALS — BP 148/87 | HR 66 | Temp 97.5°F | Resp 18 | Ht 64.0 in | Wt 245.0 lb

## 2021-06-16 DIAGNOSIS — Z01818 Encounter for other preprocedural examination: Secondary | ICD-10-CM | POA: Diagnosis not present

## 2021-06-16 DIAGNOSIS — E119 Type 2 diabetes mellitus without complications: Secondary | ICD-10-CM | POA: Diagnosis not present

## 2021-06-16 HISTORY — DX: Prediabetes: R73.03

## 2021-06-16 LAB — BASIC METABOLIC PANEL
Anion gap: 12 (ref 5–15)
BUN: 16 mg/dL (ref 8–23)
CO2: 26 mmol/L (ref 22–32)
Calcium: 9.2 mg/dL (ref 8.9–10.3)
Chloride: 98 mmol/L (ref 98–111)
Creatinine, Ser: 1.07 mg/dL (ref 0.61–1.24)
GFR, Estimated: >60 mL/min (ref >60)
Glucose, Bld: 102 mg/dL — ABNORMAL HIGH (ref 70–99)
Potassium: 3.8 mmol/L (ref 3.5–5.1)
Sodium: 136 mmol/L (ref 135–145)

## 2021-06-20 ENCOUNTER — Ambulatory Visit (HOSPITAL_COMMUNITY): Payer: Medicare Other | Admitting: Anesthesiology

## 2021-06-20 ENCOUNTER — Ambulatory Visit (HOSPITAL_COMMUNITY)
Admission: RE | Admit: 2021-06-20 | Discharge: 2021-06-20 | Disposition: A | Discharge status: Home / Self Care | Payer: Medicare Other | Attending: Internal Medicine | Admitting: Internal Medicine

## 2021-06-20 ENCOUNTER — Encounter (HOSPITAL_COMMUNITY): Payer: Self-pay

## 2021-06-20 ENCOUNTER — Encounter (HOSPITAL_COMMUNITY)
Admission: RE | Disposition: A | Discharge status: Home / Self Care | Payer: Self-pay | Source: Home / Self Care | Attending: Internal Medicine

## 2021-06-20 ENCOUNTER — Ambulatory Visit (HOSPITAL_BASED_OUTPATIENT_CLINIC_OR_DEPARTMENT_OTHER): Payer: Medicare Other | Admitting: Anesthesiology

## 2021-06-20 DIAGNOSIS — K5730 Diverticulosis of large intestine without perforation or abscess without bleeding: Secondary | ICD-10-CM

## 2021-06-20 DIAGNOSIS — K648 Other hemorrhoids: Secondary | ICD-10-CM | POA: Insufficient documentation

## 2021-06-20 DIAGNOSIS — Z6841 Body Mass Index (BMI) 40.0 and over, adult: Secondary | ICD-10-CM | POA: Insufficient documentation

## 2021-06-20 DIAGNOSIS — R7303 Prediabetes: Secondary | ICD-10-CM | POA: Diagnosis not present

## 2021-06-20 DIAGNOSIS — I10 Essential (primary) hypertension: Secondary | ICD-10-CM | POA: Insufficient documentation

## 2021-06-20 DIAGNOSIS — Z1211 Encounter for screening for malignant neoplasm of colon: Secondary | ICD-10-CM

## 2021-06-20 DIAGNOSIS — K635 Polyp of colon: Secondary | ICD-10-CM | POA: Diagnosis not present

## 2021-06-20 DIAGNOSIS — D124 Benign neoplasm of descending colon: Secondary | ICD-10-CM | POA: Insufficient documentation

## 2021-06-20 HISTORY — PX: COLONOSCOPY WITH PROPOFOL: SHX5780

## 2021-06-20 HISTORY — PX: POLYPECTOMY: SHX5525

## 2021-06-20 LAB — GLUCOSE, CAPILLARY: Glucose-Capillary: 126 mg/dL — ABNORMAL HIGH (ref 70–99)

## 2021-06-20 SURGERY — COLONOSCOPY WITH PROPOFOL
Anesthesia: General

## 2021-06-20 MED ORDER — PROPOFOL 10 MG/ML IV BOLUS
INTRAVENOUS | Status: DC | PRN
  Administered 2021-06-20: 300 mg via INTRAVENOUS
  Administered 2021-06-20: 100 mg via INTRAVENOUS

## 2021-06-20 MED ORDER — LACTATED RINGERS IV SOLN: INTRAVENOUS | Status: DC

## 2021-06-20 MED ORDER — PROPOFOL 500 MG/50ML IV EMUL
INTRAVENOUS | Status: DC | PRN
Start: 2021-06-20 — End: 2021-06-20
  Administered 2021-06-20: 150 ug/kg/min via INTRAVENOUS

## 2021-06-20 MED ORDER — LIDOCAINE HCL (CARDIAC) PF 100 MG/5ML IV SOSY
PREFILLED_SYRINGE | INTRAVENOUS | Status: DC | PRN
  Administered 2021-06-20: 50 mg via INTRAVENOUS

## 2021-06-20 NOTE — H&P (Signed)
Primary Care Physician:  Lemmie Evens, MD ?Primary Gastroenterologist:  Dr. Abbey Chatters ? ?Pre-Procedure History & Physical: ?HPI:  Blake Adams is a 66 y.o. male is here for first ever colonoscopy for colon cancer screening purposes.  Patient denies any family history of colorectal cancer.  No melena or hematochezia.  No abdominal pain or unintentional weight loss.  No change in bowel habits.  Overall feels well from a GI standpoint. ? ?Past Medical History:  ?Diagnosis Date  ? Arthritis   ? Pre-diabetes   ? ? ?Past Surgical History:  ?Procedure Laterality Date  ? HERNIA REPAIR    ? INGUINAL HERNIA REPAIR Bilateral 02/24/2013  ? Procedure: HERNIA REPAIR INGUINAL ADULT BILATERAL WITH MESH;  Surgeon: Gwenyth Ober, MD;  Location: [place];  Service: General;  Laterality: Bilateral;  ? INSERTION OF MESH Bilateral 02/24/2013  ? Procedure: INSERTION OF MESH;  Surgeon: Gwenyth Ober, MD;  Location: [place];  Service: General;  Laterality: Bilateral;  ? ? ?Prior to Admission medications   ?Medication Sig Start Date End Date Taking? Authorizing Provider  ?acetaminophen (TYLENOL) 325 MG tablet Take 650 mg by mouth as needed for moderate pain or headache.   Yes [provider]  ?Cholecalciferol (VITAMIN D3) 125 MCG (5000 UT) CAPS Take 5,000 Units by mouth daily.   Yes [provider]  ?cyanocobalamin 2000 MCG tablet Take 2,000 mcg by mouth daily.   Yes [provider]  ?folic acid (FOLVITE) 1 MG tablet Take 1 mg by mouth daily. 03/21/21  Yes [provider]  ?losartan (COZAAR) 100 MG tablet Take 100 mg by mouth daily. 04/08/21  Yes [provider]  ?metFORMIN (GLUCOPHAGE-XR) 500 MG 24 hr tablet Take 500 mg by mouth every evening. 04/08/21  Yes [provider]  ?methotrexate 2.5 MG tablet Take 20 mg by mouth once a week. 02/20/21  Yes [provider]  ?naproxen sodium (ALEVE) 220 MG tablet Take 220 mg by mouth daily as needed (pain).    Yes [provider]  ?zinc gluconate 50 MG tablet Take 50 mg by mouth daily.   Yes [provider]  ? ? ?Allergies as of 06/06/2021  ? (No Known Allergies)  ? ? ?Family History  ?Problem Relation Age of Onset  ? Cancer Maternal Aunt   ?     breast  ? Cancer Paternal Uncle   ?     colon  ? Cancer Maternal Aunt   ?     OVARIAN  ? ? ?Social History  ? ?Socioeconomic History  ? Marital status: Single  ?  Spouse name: Not on file  ? Number of children: Not on file  ? Years of education: Not on file  ? Highest education level: Not on file  ?Occupational History  ? Not on file  ?Tobacco Use  ? Smoking status: Never  ? Smokeless tobacco: Never  ?Vaping Use  ? Vaping Use: Never used  ?Substance and Sexual Activity  ? Alcohol use: No  ? Drug use: No  ? Sexual activity: Not on file  ?Other Topics Concern  ? Not on file  ?Social History Narrative  ? Not on file  ? ?Social Determinants of Health  ? ?Financial Resource Strain: Not on file  ?Food Insecurity: Not on file  ?Transportation Needs: Not on file  ?Physical Activity: Not on file  ?Stress: Not on file  ?Social Connections: Not on file  ?Intimate Partner Violence: Not on file  ? ? ?  Review of Systems: ?See HPI, otherwise negative ROS ? ?Physical Exam: ?Vital signs in last 24 hours: ?Temp:  [98.2 ?F (36.8 ?C)] 98.2 ?F (36.8 ?C) (04/03 1036) ?Pulse Rate:  [77-86] 77 (04/03 1036) ?Resp:  [21-22] 22 (04/03 1036) ?BP: (122-139)/(47-107) 122/47 (04/03 1036) ?SpO2:  [96 %-97 %] 96 % (04/03 1036) ?Weight:  [111.1 kg] 111.1 kg (04/03 1036) ?  ?General:   Alert,  Well-developed, well-nourished, pleasant and cooperative in NAD ?Head:  Normocephalic and atraumatic. ?Eyes:  Sclera clear, no icterus.   Conjunctiva pink. ?Ears:  Normal auditory acuity. ?Nose:  No deformity, discharge,  or lesions. ?Mouth:  No deformity or lesions, dentition normal. ?Neck:  Supple; no masses or thyromegaly. ?Lungs:  Clear throughout to auscultation.   No wheezes, crackles, or rhonchi. No  acute distress. ?Heart:  Regular rate and rhythm; no murmurs, clicks, rubs,  or gallops. ?Abdomen:  Soft, nontender and nondistended. No masses, hepatosplenomegaly or hernias noted. Normal bowel sounds, without guarding, and without rebound.   ?Msk:  Symmetrical without gross deformities. Normal posture. ?Extremities:  Without clubbing or edema. ?Neurologic:  Alert and  oriented x4;  grossly normal neurologically. ?Skin:  Intact without significant lesions or rashes. ?Cervical Nodes:  No significant cervical adenopathy. ?Psych:  Alert and cooperative. Normal mood and affect. ? ?Impression/Plan: ?[place] is here for a colonoscopy to be performed for colon cancer screening purposes. ? ?The risks of the procedure including infection, bleed, or perforation as well as benefits, limitations, alternatives and imponderables have been reviewed with the patient. Questions have been answered. All parties agreeable. ? ?

## 2021-06-20 NOTE — Op Note (Signed)
[facility] ?Patient Name: Blake Adams ?Procedure Date: 06/20/2021 11:01 AM ?MRN: [id number] ?Date of Birth: [birth year] ?Attending MD: Elon Alas. Abbey Chatters , DO ?CSN: [id number] ?Age: 66 ?Admit Type: Outpatient ?Procedure:                Colonoscopy ?Indications:              Screening for colorectal malignant neoplasm ?Providers:                Elon Alas. Abbey Chatters, DO, Caprice Kluver, Aram Candela ?Referring MD:              ?Medicines:                See the Anesthesia note for documentation of the  ?                          administered medications ?Complications:            No immediate complications. ?Estimated Blood Loss:     Estimated blood loss was minimal. ?Procedure:                Pre-Anesthesia Assessment: ?                          - The anesthesia plan was to use monitored  ?                          anesthesia care (MAC). ?                          After obtaining informed consent, the colonoscope  ?                          was passed under direct vision. Throughout the  ?                          procedure, the patient's blood pressure, pulse, and  ?                          oxygen saturations were monitored continuously. The  ?                          [id number] ([id number]) was introduced through the  ?                          anus and advanced to the the cecum, identified by  ?                          appendiceal orifice and ileocecal valve. The  ?                          colonoscopy was performed without difficulty. The  ?                          patient tolerated the procedure well. The quality  ?                          of the  bowel preparation was evaluated using the  ?                          BBPS [facility] Bowel Preparation Scale) with scores  ?                          of: Right Colon = 3, Transverse Colon = 3 and Left  ?                          Colon = 3 (entire mucosa seen well with no residual  ?                          staining, small fragments of stool or opaque  ?                           liquid). The total BBPS score equals 9. ?Scope In: 11:17:41 AM ?Scope Out: 11:29:28 AM ?Scope Withdrawal Time: 0 hours 9 minutes 9 seconds  ?Total Procedure Duration: 0 hours 11 minutes 47 seconds  ?Findings: ?     The perianal and digital rectal examinations were normal. ?     Non-bleeding internal hemorrhoids were found during endoscopy. ?     Multiple small and large-mouthed diverticula were found in the entire  ?     colon. ?     A 5 mm polyp was found in the descending colon. The polyp was sessile.  ?     The polyp was removed with a cold snare. Resection and retrieval were  ?     complete. ?     The exam was otherwise without abnormality. ?Impression:               - Non-bleeding internal hemorrhoids. ?                          - Diverticulosis in the entire examined colon. ?                          - One 5 mm polyp in the descending colon, removed  ?                          with a cold snare. Resected and retrieved. ?                          - The examination was otherwise normal. ?Moderate Sedation: ?     Per Anesthesia Care ?Recommendation:           - Patient has a contact number available for  ?                          emergencies. The signs and symptoms of potential  ?                          delayed complications were discussed with the  ?                          patient. Return to normal activities tomorrow.  ?  Written discharge instructions were provided to the  ?                          patient. ?                          - Resume previous diet. ?                          - Continue present medications. ?                          - Await pathology results. ?                          - Repeat colonoscopy in 5 years for surveillance. ?                          - Return to GI clinic PRN. ?Procedure Code(s):        --- Professional --- ?                          [phone], Colonoscopy, flexible; with removal of  ?                          tumor(s), polyp(s), or other  lesion(s) by snare  ?                          technique ?Diagnosis Code(s):        --- Professional --- ?                          Z12.11, Encounter for screening for malignant  ?                          neoplasm of colon ?                          K64.8, Other hemorrhoids ?                          K63.5, Polyp of colon ?                          K57.30, Diverticulosis of large intestine without  ?                          perforation or abscess without bleeding ?CPT copyright 2019 American Medical Association. All rights reserved. ?The codes documented in this report are preliminary and upon coder review may  ?be revised to meet current compliance requirements. ?Elon Alas. Abbey Chatters, DO ?Elon Alas. [place], DO ?06/20/2021 11:31:40 AM ?This report has been signed electronically. ?Number of Addenda: 0 ?

## 2021-06-20 NOTE — Transfer of Care (Signed)
Immediate Anesthesia Transfer of Care Note ? ?Patient: Blake Adams ? ?Procedure(s) Performed: COLONOSCOPY WITH PROPOFOL ?POLYPECTOMY ? ?Patient Location: Short Stay ? ?Anesthesia Type:General ? ?Level of Consciousness: awake ? ?Airway & Oxygen Therapy: Patient Spontanous Breathing ? ?Post-op Assessment: Report given to RN and Post -op Vital signs reviewed and stable ? ?Post vital signs: Reviewed and stable ? ?Last Vitals:  ?Vitals Value Taken Time  ?BP 115/64 06/20/21 1134  ?Temp 36.7 ?C 06/20/21 1134  ?Pulse 83 06/20/21 1134  ?Resp 23 06/20/21 1134  ?SpO2 92 % 06/20/21 1134  ? ? ?Last Pain:  ?Vitals:  ? 06/20/21 1134  ?TempSrc: Oral  ?PainSc: 0-No pain  ?   ? ?  ? ?Complications: No notable events documented. ?

## 2021-06-20 NOTE — Anesthesia Preprocedure Evaluation (Signed)
Anesthesia Evaluation  ?Patient identified by MRN, date of birth, ID band ?Patient awake ? ? ? ?Reviewed: ?Allergy & Precautions, NPO status , Patient's Chart, lab work & pertinent test results ? ?Airway ?Mallampati: II ? ?TM Distance: >3 FB ?Neck ROM: Full ? ? ? Dental ? ?(+) Dental Advisory Given, Missing ?  ?Pulmonary ?neg pulmonary ROS,  ?  ?Pulmonary exam normal ?breath sounds clear to auscultation ? ? ? ? ? ? Cardiovascular ?hypertension, Pt. on medications ?Normal cardiovascular exam ?Rhythm:Regular Rate:Normal ? ? ?  ?Neuro/Psych ?negative neurological ROS ? negative psych ROS  ? GI/Hepatic ?negative GI ROS, Neg liver ROS,   ?Endo/Other  ?diabetes (pre)Morbid obesity ? Renal/GU ?negative Renal ROS  ?negative genitourinary ?  ?Musculoskeletal ? ?(+) Arthritis , Osteoarthritis and Rheumatoid disorders,   ? Abdominal ?  ?Peds ?negative pediatric ROS ?(+)  Hematology ?negative hematology ROS ?(+)   ?Anesthesia Other Findings ? ? Reproductive/Obstetrics ?negative OB ROS ? ?  ? ? ? ? ? ? ? ? ? ? ? ? ? ?  ?  ? ? ? ? ? ? ? ? ?Anesthesia Physical ?Anesthesia Plan ? ?ASA: 3 ? ?Anesthesia Plan: General  ? ?Post-op Pain Management: Minimal or no pain anticipated  ? ?Induction: Intravenous ? ?PONV Risk Score and Plan: Treatment may vary due to age or medical condition ? ?Airway Management Planned: Nasal Cannula and Natural Airway ? ?Additional Equipment:  ? ?Intra-op Plan:  ? ?Post-operative Plan:  ? ?Informed Consent: I have reviewed the patients History and Physical, chart, labs and discussed the procedure including the risks, benefits and alternatives for the proposed anesthesia with the patient or authorized representative who has indicated his/her understanding and acceptance.  ? ? ? ?Dental advisory given ? ?Plan Discussed with: CRNA and Surgeon ? ?Anesthesia Plan Comments:   ? ? ? ? ? ? ?Anesthesia Quick Evaluation ? ?

## 2021-06-20 NOTE — Discharge Instructions (Addendum)
?  Colonoscopy Discharge Instructions  Read the instructions outlined below and refer to this sheet in the next few weeks. These discharge instructions provide you with general information on caring for yourself after you leave the hospital. Your doctor may also give you specific instructions. While your treatment has been planned according to the most current medical practices available, unavoidable complications occasionally occur.   ACTIVITY You may resume your regular activity, but move at a slower pace for the next 24 hours.  Take frequent rest periods for the next 24 hours.  Walking will help get rid of the air and reduce the bloated feeling in your belly (abdomen).  No driving for 24 hours (because of the medicine (anesthesia) used during the test).   Do not sign any important legal documents or operate any machinery for 24 hours (because of the anesthesia used during the test).  NUTRITION Drink plenty of fluids.  You may resume your normal diet as instructed by your doctor.  Begin with a light meal and progress to your normal diet. Heavy or fried foods are harder to digest and may make you feel sick to your stomach (nauseated).  Avoid alcoholic beverages for 24 hours or as instructed.  MEDICATIONS You may resume your normal medications unless your doctor tells you otherwise.  WHAT YOU CAN EXPECT TODAY Some feelings of bloating in the abdomen.  Passage of more gas than usual.  Spotting of blood in your stool or on the toilet paper.  IF YOU HAD POLYPS REMOVED DURING THE COLONOSCOPY: No aspirin products for 7 days or as instructed.  No alcohol for 7 days or as instructed.  Eat a soft diet for the next 24 hours.  FINDING OUT THE RESULTS OF YOUR TEST Not all test results are available during your visit. If your test results are not back during the visit, make an appointment with your caregiver to find out the results. Do not assume everything is normal if you have not heard from your  caregiver or the medical facility. It is important for you to follow up on all of your test results.  SEEK IMMEDIATE MEDICAL ATTENTION IF: You have more than a spotting of blood in your stool.  Your belly is swollen (abdominal distention).  You are nauseated or vomiting.  You have a temperature over 101.  You have abdominal pain or discomfort that is severe or gets worse throughout the day.   Your colonoscopy revealed 1 polyp(s) which I removed successfully. Await pathology results, my office will contact you. I recommend repeating colonoscopy in 5 years for surveillance purposes.   You also have diverticulosis and internal hemorrhoids. I would recommend increasing fiber in your diet or adding OTC Benefiber/Metamucil. Be sure to drink at least 4 to 6 glasses of water daily. Follow-up with GI as needed.   I hope you have a great rest of your week!  Charles K. Carver, D.O. Gastroenterology and Hepatology [facility]  

## 2021-06-20 NOTE — Anesthesia Postprocedure Evaluation (Signed)
Anesthesia Post Note ? ?Patient: Blake Adams ? ?Procedure(s) Performed: COLONOSCOPY WITH PROPOFOL ?POLYPECTOMY ? ?Patient location during evaluation: Phase II ?Anesthesia Type: General ?Level of consciousness: awake and alert and oriented ?Pain management: pain level controlled ?Vital Signs Assessment: post-procedure vital signs reviewed and stable ?Respiratory status: spontaneous breathing, nonlabored ventilation and respiratory function stable ?Cardiovascular status: blood pressure returned to baseline and stable ?Postop Assessment: no apparent nausea or vomiting ?Anesthetic complications: no ? ? ?No notable events documented. ? ? ?Last Vitals:  ?Vitals:  ? 06/20/21 1036 06/20/21 1134  ?BP: (!) 122/47 115/64  ?Pulse: 77 83  ?Resp: (!) 22 (!) 23  ?Temp: 36.8 ?C 36.7 ?C  ?SpO2: 96% 92%  ?  ?Last Pain:  ?Vitals:  ? 06/20/21 1134  ?TempSrc: Oral  ?PainSc: 0-No pain  ? ? ?  ?  ?  ?  ?  ?  ? ?Verland Sprinkle C Zuzanna Maroney ? ? ? ? ?

## 2021-06-21 LAB — SURGICAL PATHOLOGY

## 2021-06-27 ENCOUNTER — Encounter (HOSPITAL_COMMUNITY): Payer: Self-pay | Admitting: Internal Medicine

## 2024-03-24 ENCOUNTER — Ambulatory Visit: Admitting: Urology

## 2024-03-24 ENCOUNTER — Encounter: Payer: Self-pay | Admitting: Urology

## 2024-03-24 VITALS — BP 126/74 | HR 68

## 2024-03-24 DIAGNOSIS — R972 Elevated prostate specific antigen [PSA]: Secondary | ICD-10-CM | POA: Diagnosis not present

## 2024-03-24 DIAGNOSIS — R3915 Urgency of urination: Secondary | ICD-10-CM

## 2024-03-24 DIAGNOSIS — N401 Enlarged prostate with lower urinary tract symptoms: Secondary | ICD-10-CM | POA: Diagnosis not present

## 2024-03-24 NOTE — Progress Notes (Addendum)
 "  03/24/2024 3:41 PM   Blake Adams Oct 24, 1955 [id number]  Referring provider: Joeann Browning, FNP [address] [place],  [place] [address]  No chief complaint on file.   HPI: New patient-   1) PSA elevation - PSA 7.4 in 2017 s/p negative prostate biopsy with Dr. Watt. Prostate 31 g. PSA in 2020 was 8.9 and 6.0 with 23% f/t ratio. No FH PCa. No OAC. Oct 2025 PSA 10.9. Cr 1.17, gfr 67.   2) BPH - prostate 31 g. Occasionally has a reduced stream at night. He wakes to take his dog out 3 x nightly and voids then. His IPSS was 2.  03/24/2024: Today, patient seen for the above.  UA clear.  IPSS 5. He gets some urgency and UUI. No pad use.   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    PMH: Past Medical History:  Diagnosis Date   Arthritis    Pre-diabetes     Surgical History: Past Surgical History:  Procedure Laterality Date   COLONOSCOPY WITH PROPOFOL  N/A 06/20/2021   Procedure: COLONOSCOPY WITH PROPOFOL ;  Surgeon: Blake Carlin POUR, DO;  Location: [facility];  Service: Endoscopy;  Laterality: N/A;  12:00 / ASA 3   HERNIA REPAIR     INGUINAL HERNIA REPAIR Bilateral 02/24/2013   Procedure: HERNIA REPAIR INGUINAL ADULT BILATERAL WITH MESH;  Surgeon: Blake MALVA Pina, MD;  Location: [facility];  Service: General;  Laterality: Bilateral;   INSERTION OF MESH Bilateral 02/24/2013   Procedure: INSERTION OF MESH;  Surgeon: Blake MALVA Pina, MD;  Location: [facility];  Service: General;  Laterality: Bilateral;   POLYPECTOMY  06/20/2021   Procedure: POLYPECTOMY;  Surgeon: Blake Carlin POUR, DO;  Location: [facility];  Service: Endoscopy;;    Home Medications:  Allergies as of 03/24/2024   No Known Allergies      Medication List        Accurate as of March 24, 2024  3:41 PM. If you have any questions, ask your nurse or doctor.          acetaminophen  325 MG tablet Commonly known as: TYLENOL  Take 650 mg by mouth as needed for moderate pain or headache.    cyanocobalamin 2000 MCG tablet Take 2,000 mcg by mouth daily.   folic acid 1 MG tablet Commonly known as: FOLVITE Take 1 mg by mouth daily.   losartan 100 MG tablet Commonly known as: COZAAR Take 100 mg by mouth daily.   metFORMIN 500 MG 24 hr tablet Commonly known as: GLUCOPHAGE-XR Take 500 mg by mouth every evening.   methotrexate 2.5 MG tablet Commonly known as: RHEUMATREX Take 20 mg by mouth once a week.   naproxen sodium 220 MG tablet Commonly known as: ALEVE Take 220 mg by mouth daily as needed (pain).   Vitamin D3 125 MCG (5000 UT) Caps Take 5,000 Units by mouth daily.   zinc gluconate 50 MG tablet Take 50 mg by mouth daily.        Allergies: Allergies[1]  Family History: Family History  Problem Relation Age of Onset   Cancer Maternal Aunt        breast   Cancer Paternal Uncle        colon   Cancer Maternal Aunt        OVARIAN    Social History:  reports that he has never smoked. He has never used smokeless tobacco. He reports that he does not drink alcohol and does not use  drugs.   Physical Exam: BP 126/74   Pulse 68   Constitutional:  Alert and oriented, No acute distress. HEENT: [place] AT, moist mucus membranes.  Trachea midline, no masses. Cardiovascular: No clubbing, cyanosis, or edema. Respiratory: Normal respiratory effort, no increased work of breathing. GI: Abdomen is soft, nontender, nondistended, no abdominal masses GU: No CVA tenderness Skin: No rashes, bruises or suspicious lesions. Neurologic: Grossly intact, no focal deficits, moving all 4 extremities. Psychiatric: Normal mood and affect.  Laboratory Data: Lab Results  Component Value Date   WBC 10.5 02/21/2013   HGB 13.3 02/24/2013   HCT 44.2 02/21/2013   MCV 88.6 02/21/2013   PLT 317 02/21/2013    Lab Results  Component Value Date   CREATININE 1.07 06/16/2021    No results found for: PSA  No results found for: TESTOSTERONE  No results found for:  HGBA1C  Urinalysis    Component Value Date/Time   COLORURINE YELLOW 08/04/2016 1110   APPEARANCEUR CLEAR 08/04/2016 1110   LABSPEC 1.019 08/04/2016 1110   PHURINE 5.0 08/04/2016 1110   GLUCOSEU NEGATIVE 08/04/2016 1110   HGBUR SMALL (A) 08/04/2016 1110   BILIRUBINUR NEGATIVE 08/04/2016 1110   KETONESUR NEGATIVE 08/04/2016 1110   PROTEINUR NEGATIVE 08/04/2016 1110   NITRITE NEGATIVE 08/04/2016 1110   LEUKOCYTESUR NEGATIVE 08/04/2016 1110    Lab Results  Component Value Date   BACTERIA NONE SEEN 08/04/2016    Pertinent Imaging: N/a   Assessment & Plan:    1. Elevated PSA (Primary) I had a long discussion with the patient on the nature of elevated PSA - benign vs malignant causes. We discussed age specific levels and PCa risk stratification. We discussed the nature risks and benefits of continued surveillance, other lab tests, imaging as well as prostate biopsy. We discussed the management of prostate cancer might include active surveillance or treatment depending on biopsy findings. All questions answered.  PSA repeated  - consider MRI and repeat biopsy.  Follow-up in 3 months. Used the videoboard to discuss.   ADD: Jan 2026 PSA 11.7. MRI ordered.   - Urinalysis, Routine w reflex microscopic; Future - Urinalysis, Routine w reflex microscopic  2.  BPH, urgency - discussed alpha blockers or pde5i.   No follow-ups on file.  Blake Brooks, MD  [facility]  [address] [place], [place] [address] [phone]      [1] No Known Allergies  "

## 2024-03-25 ENCOUNTER — Ambulatory Visit: Payer: Self-pay

## 2024-03-25 DIAGNOSIS — R972 Elevated prostate specific antigen [PSA]: Secondary | ICD-10-CM

## 2024-03-25 LAB — URINALYSIS, ROUTINE W REFLEX MICROSCOPIC
Bilirubin, UA: NEGATIVE
Glucose, UA: NEGATIVE
Ketones, UA: NEGATIVE
Leukocytes,UA: NEGATIVE
Nitrite, UA: NEGATIVE
Protein,UA: NEGATIVE
RBC, UA: NEGATIVE
Specific Gravity, UA: 1.025 (ref 1.005–1.030)
Urobilinogen, Ur: 0.2 mg/dL (ref 0.2–1.0)
pH, UA: 5.5 (ref 5.0–7.5)

## 2024-03-25 LAB — PSA TOTAL (REFLEX TO FREE): Prostate Specific Ag, Serum: 11.7 ng/mL — ABNORMAL HIGH (ref 0.0–4.0)

## 2024-03-29 NOTE — Addendum Note (Signed)
 Addended by: NIEVES COUGH R on: 03/29/2024 02:14 PM   Modules accepted: Orders

## 2024-03-31 NOTE — Telephone Encounter (Signed)
-----   Message from Donnice Brooks, MD sent at 03/29/2024  2:15 PM EST ----- Let Blake Adams know his PSA is up over 11, so I do recommend an MRI scan. Order placed and I'll let him know the results. Thank you.  ----- Message ----- From: Sammie Exie HERO, CMA Sent: 03/25/2024   7:34 AM EST To: Donnice Brooks, MD  Please review.

## 2024-03-31 NOTE — Telephone Encounter (Signed)
 Called patient to give him his PSA results per MD Eskridge left a detailed message and advised patient to call back if he has any questions or concerns.

## 2024-04-11 ENCOUNTER — Ambulatory Visit (HOSPITAL_COMMUNITY)
Admission: RE | Admit: 2024-04-11 | Discharge: 2024-04-11 | Disposition: A | Source: Ambulatory Visit | Attending: Urology | Admitting: Urology

## 2024-04-11 DIAGNOSIS — R972 Elevated prostate specific antigen [PSA]: Secondary | ICD-10-CM | POA: Diagnosis present

## 2024-04-11 MED ORDER — GADOBUTROL 1 MMOL/ML IV SOLN
10.0000 mL | Freq: Once | INTRAVENOUS | Status: AC | PRN
  Administered 2024-04-11: 10 mL via INTRAVENOUS

## 2024-04-15 NOTE — Addendum Note (Signed)
 Addended by: SAMMIE EXIE HERO on: 04/15/2024 04:56 PM   Modules accepted: Orders

## 2024-04-15 NOTE — Telephone Encounter (Signed)
-----   Message from Donnice Brooks, MD sent at 04/15/2024  3:58 PM EST ----- Please notify Hallie his prostate MRI looks good - it was negative. No sign of any prostate cancer.  We do need to continue to follow his PSA.  He should see me back in 3 months with a PSA prior.   Thank you. ----- Message ----- From: Sammie Exie HERO, CMA Sent: 04/15/2024  10:09 AM EST To: Donnice Brooks, MD  MRI results please review.

## 2024-04-15 NOTE — Telephone Encounter (Signed)
 Called and left detailed message regarding PSA results for patient lvm for patient to call back if needed patient scheduled for labs per MD request

## 2024-09-15 ENCOUNTER — Other Ambulatory Visit

## 2024-09-22 ENCOUNTER — Ambulatory Visit: Admitting: Urology
